# Patient Record
Sex: Female | Born: 1955
Health system: Southern US, Community
[De-identification: ages and names within clinical notes are randomized; demographics above are authoritative.]

## PROBLEM LIST (undated history)

## (undated) DIAGNOSIS — K2970 Gastritis, unspecified, without bleeding: Secondary | ICD-10-CM

## (undated) DIAGNOSIS — F09 Unspecified mental disorder due to known physiological condition: Secondary | ICD-10-CM

## (undated) DIAGNOSIS — M797 Fibromyalgia: Secondary | ICD-10-CM

## (undated) DIAGNOSIS — D649 Anemia, unspecified: Secondary | ICD-10-CM

## (undated) DIAGNOSIS — F32A Depression, unspecified: Secondary | ICD-10-CM

## (undated) DIAGNOSIS — N3281 Overactive bladder: Secondary | ICD-10-CM

## (undated) DIAGNOSIS — M810 Age-related osteoporosis without current pathological fracture: Secondary | ICD-10-CM

## (undated) DIAGNOSIS — M4802 Spinal stenosis, cervical region: Secondary | ICD-10-CM

## (undated) DIAGNOSIS — E119 Type 2 diabetes mellitus without complications: Secondary | ICD-10-CM

## (undated) DIAGNOSIS — M7500 Adhesive capsulitis of unspecified shoulder: Secondary | ICD-10-CM

## (undated) DIAGNOSIS — G5600 Carpal tunnel syndrome, unspecified upper limb: Secondary | ICD-10-CM

## (undated) DIAGNOSIS — E785 Hyperlipidemia, unspecified: Secondary | ICD-10-CM

## (undated) DIAGNOSIS — K219 Gastro-esophageal reflux disease without esophagitis: Secondary | ICD-10-CM

## (undated) DIAGNOSIS — Z9889 Other specified postprocedural states: Secondary | ICD-10-CM

## (undated) DIAGNOSIS — D1724 Benign lipomatous neoplasm of skin and subcutaneous tissue of left leg: Secondary | ICD-10-CM

## (undated) DIAGNOSIS — I10 Essential (primary) hypertension: Secondary | ICD-10-CM

## (undated) DIAGNOSIS — E538 Deficiency of other specified B group vitamins: Secondary | ICD-10-CM

## (undated) DIAGNOSIS — F419 Anxiety disorder, unspecified: Secondary | ICD-10-CM

## (undated) DIAGNOSIS — B9681 Helicobacter pylori [H. pylori] as the cause of diseases classified elsewhere: Secondary | ICD-10-CM

## (undated) DIAGNOSIS — E559 Vitamin D deficiency, unspecified: Secondary | ICD-10-CM

## (undated) DIAGNOSIS — M48061 Spinal stenosis, lumbar region without neurogenic claudication: Secondary | ICD-10-CM

## (undated) DIAGNOSIS — I5030 Unspecified diastolic (congestive) heart failure: Secondary | ICD-10-CM

## (undated) HISTORY — PX: COLONOSCOPY: SHX174

## (undated) HISTORY — DX: Type 2 diabetes mellitus without complications: E11.9

## (undated) HISTORY — PX: COLPORRHAPHY: SHX921

## (undated) HISTORY — PX: ABDOMINAL HYSTERECTOMY: SHX81

## (undated) HISTORY — PX: LIPOMA EXCISION: SHX5283

## (undated) HISTORY — PX: ESOPHAGOGASTRODUODENOSCOPY: SHX1529

---

## 2006-10-13 DIAGNOSIS — M25519 Pain in unspecified shoulder: Secondary | ICD-10-CM | POA: Insufficient documentation

## 2006-10-13 DIAGNOSIS — E1165 Type 2 diabetes mellitus with hyperglycemia: Secondary | ICD-10-CM | POA: Insufficient documentation

## 2008-04-18 DIAGNOSIS — R1031 Right lower quadrant pain: Secondary | ICD-10-CM | POA: Insufficient documentation

## 2008-07-26 DIAGNOSIS — D508 Other iron deficiency anemias: Secondary | ICD-10-CM | POA: Insufficient documentation

## 2009-10-08 DIAGNOSIS — J984 Other disorders of lung: Secondary | ICD-10-CM | POA: Insufficient documentation

## 2013-03-28 DIAGNOSIS — M48061 Spinal stenosis, lumbar region without neurogenic claudication: Secondary | ICD-10-CM | POA: Insufficient documentation

## 2013-03-28 DIAGNOSIS — Z794 Long term (current) use of insulin: Secondary | ICD-10-CM | POA: Insufficient documentation

## 2014-12-24 DIAGNOSIS — Z9889 Other specified postprocedural states: Secondary | ICD-10-CM | POA: Insufficient documentation

## 2014-12-26 DIAGNOSIS — D126 Benign neoplasm of colon, unspecified: Secondary | ICD-10-CM | POA: Insufficient documentation

## 2015-01-01 DIAGNOSIS — K2970 Gastritis, unspecified, without bleeding: Secondary | ICD-10-CM | POA: Insufficient documentation

## 2015-05-14 DIAGNOSIS — M797 Fibromyalgia: Secondary | ICD-10-CM | POA: Insufficient documentation

## 2015-05-15 DIAGNOSIS — E559 Vitamin D deficiency, unspecified: Secondary | ICD-10-CM | POA: Insufficient documentation

## 2015-05-15 DIAGNOSIS — E782 Mixed hyperlipidemia: Secondary | ICD-10-CM | POA: Insufficient documentation

## 2015-06-09 DIAGNOSIS — F418 Other specified anxiety disorders: Secondary | ICD-10-CM | POA: Insufficient documentation

## 2015-10-06 DIAGNOSIS — Z860101 Personal history of adenomatous and serrated colon polyps: Secondary | ICD-10-CM | POA: Insufficient documentation

## 2016-01-19 ENCOUNTER — Encounter: Payer: Self-pay | Admitting: Psychology

## 2016-01-29 ENCOUNTER — Encounter: Payer: Self-pay | Admitting: Podiatry

## 2016-01-29 ENCOUNTER — Ambulatory Visit (INDEPENDENT_AMBULATORY_CARE_PROVIDER_SITE_OTHER): Payer: Medicare Other | Admitting: Podiatry

## 2016-01-29 VITALS — BP 117/57 | HR 66 | Resp 18

## 2016-01-29 DIAGNOSIS — M79676 Pain in unspecified toe(s): Secondary | ICD-10-CM

## 2016-01-29 DIAGNOSIS — E119 Type 2 diabetes mellitus without complications: Secondary | ICD-10-CM

## 2016-01-29 DIAGNOSIS — B351 Tinea unguium: Secondary | ICD-10-CM

## 2016-01-29 NOTE — Progress Notes (Signed)
   Subjective:    Patient ID: Patricia Knapp, female    DOB: 02/25/1956, 60 y.o.   MRN: [id number]  HPI this patient presents the office with chief complaint of long thick painful nails. Patient states the nails are painful walking and wearing her shoes. She says she has just moved to the area and has been seeing a podiatrist at her old city. She says that she is diabetic with sleep apnea, fibromyalgia and back stenosis. She presents the office today for an evaluation of her feet and treatment of her long thick painful nails    Review of Systems  All other systems reviewed and are negative.      Objective:   Physical Exam GENERAL APPEARANCE: Alert, conversant. Appropriately groomed. No acute distress.  VASCULAR: Pedal pulses are  palpable at  [facility] bilateral.  Capillary refill time is immediate to all digits,  Normal temperature gradient.   NEUROLOGIC: sensation is normal to 5.07 monofilament at 5/5 sites bilateral.  Light touch is intact bilateral, Muscle strength normal.  MUSCULOSKELETAL: acceptable muscle strength, tone and stability bilateral.  Intrinsic muscluature intact bilateral.  Rectus appearance of foot and digits noted bilateral.   DERMATOLOGIC: skin color, texture, and turgor are within normal limits.  No preulcerative lesions or ulcers  are seen, no interdigital maceration noted.  No open lesions present.  . No drainage noted.  NAILS  Thick disfigured discolored nails both feet.         Assessment & Plan:  Onychomycosis  B/L  Diabetes with no complications.  IE.  Diabetic foot exam was performed and found to be within normal limits. Debridement of mycotic nails was performed. Return to the clinic in 3 months for preventative foot care services   Helane GuntherGregory Mayer DPM

## 2016-03-02 ENCOUNTER — Encounter: Payer: Self-pay | Admitting: Psychology

## 2016-03-08 ENCOUNTER — Encounter: Payer: Self-pay | Admitting: Psychology

## 2016-04-12 ENCOUNTER — Encounter: Payer: Self-pay | Admitting: Psychology

## 2016-04-22 ENCOUNTER — Ambulatory Visit: Payer: Medicare Other | Admitting: Podiatry

## 2016-07-19 DIAGNOSIS — E785 Hyperlipidemia, unspecified: Secondary | ICD-10-CM | POA: Insufficient documentation

## 2019-04-16 ENCOUNTER — Ambulatory Visit: Payer: MEDICARE | Admitting: Podiatry

## 2019-05-30 ENCOUNTER — Other Ambulatory Visit: Payer: Self-pay | Admitting: Orthopedic Surgery

## 2019-05-30 DIAGNOSIS — G8929 Other chronic pain: Secondary | ICD-10-CM

## 2019-05-30 DIAGNOSIS — M5442 Lumbago with sciatica, left side: Secondary | ICD-10-CM

## 2019-05-30 DIAGNOSIS — M4807 Spinal stenosis, lumbosacral region: Secondary | ICD-10-CM

## 2019-06-13 ENCOUNTER — Ambulatory Visit: Payer: Medicare HMO

## 2019-07-02 ENCOUNTER — Ambulatory Visit: Payer: Medicare HMO

## 2019-07-04 ENCOUNTER — Ambulatory Visit: Payer: Medicare HMO

## 2020-04-21 DIAGNOSIS — R6889 Other general symptoms and signs: Secondary | ICD-10-CM | POA: Diagnosis not present

## 2020-04-21 DIAGNOSIS — E1142 Type 2 diabetes mellitus with diabetic polyneuropathy: Secondary | ICD-10-CM | POA: Diagnosis not present

## 2020-04-21 DIAGNOSIS — Z6841 Body Mass Index (BMI) 40.0 and over, adult: Secondary | ICD-10-CM | POA: Diagnosis not present

## 2020-04-21 DIAGNOSIS — B351 Tinea unguium: Secondary | ICD-10-CM | POA: Diagnosis not present

## 2020-05-27 ENCOUNTER — Emergency Department
Admission: EM | Admit: 2020-05-27 | Discharge: 2020-05-27 | Disposition: A | Discharge status: Home / Self Care | Payer: Medicare HMO | Attending: Emergency Medicine | Admitting: Emergency Medicine

## 2020-05-27 ENCOUNTER — Emergency Department: Payer: Medicare HMO

## 2020-05-27 ENCOUNTER — Ambulatory Visit: Payer: Self-pay

## 2020-05-27 ENCOUNTER — Other Ambulatory Visit: Payer: Self-pay

## 2020-05-27 DIAGNOSIS — R071 Chest pain on breathing: Secondary | ICD-10-CM | POA: Diagnosis not present

## 2020-05-27 DIAGNOSIS — E119 Type 2 diabetes mellitus without complications: Secondary | ICD-10-CM | POA: Diagnosis not present

## 2020-05-27 DIAGNOSIS — J45901 Unspecified asthma with (acute) exacerbation: Secondary | ICD-10-CM | POA: Insufficient documentation

## 2020-05-27 DIAGNOSIS — Z79899 Other long term (current) drug therapy: Secondary | ICD-10-CM | POA: Diagnosis not present

## 2020-05-27 DIAGNOSIS — R059 Cough, unspecified: Secondary | ICD-10-CM | POA: Diagnosis not present

## 2020-05-27 DIAGNOSIS — J42 Unspecified chronic bronchitis: Secondary | ICD-10-CM | POA: Insufficient documentation

## 2020-05-27 DIAGNOSIS — Z91040 Latex allergy status: Secondary | ICD-10-CM | POA: Diagnosis not present

## 2020-05-27 DIAGNOSIS — J181 Lobar pneumonia, unspecified organism: Secondary | ICD-10-CM | POA: Insufficient documentation

## 2020-05-27 DIAGNOSIS — K449 Diaphragmatic hernia without obstruction or gangrene: Secondary | ICD-10-CM | POA: Diagnosis not present

## 2020-05-27 DIAGNOSIS — E1165 Type 2 diabetes mellitus with hyperglycemia: Secondary | ICD-10-CM | POA: Diagnosis not present

## 2020-05-27 DIAGNOSIS — Z20822 Contact with and (suspected) exposure to covid-19: Secondary | ICD-10-CM | POA: Diagnosis not present

## 2020-05-27 DIAGNOSIS — Z794 Long term (current) use of insulin: Secondary | ICD-10-CM | POA: Diagnosis not present

## 2020-05-27 DIAGNOSIS — R079 Chest pain, unspecified: Secondary | ICD-10-CM | POA: Diagnosis not present

## 2020-05-27 DIAGNOSIS — J189 Pneumonia, unspecified organism: Secondary | ICD-10-CM | POA: Diagnosis not present

## 2020-05-27 DIAGNOSIS — R0789 Other chest pain: Secondary | ICD-10-CM | POA: Diagnosis not present

## 2020-05-27 DIAGNOSIS — R0602 Shortness of breath: Secondary | ICD-10-CM | POA: Diagnosis not present

## 2020-05-27 DIAGNOSIS — J4531 Mild persistent asthma with (acute) exacerbation: Secondary | ICD-10-CM | POA: Diagnosis not present

## 2020-05-27 LAB — CBC WITH DIFFERENTIAL/PLATELET
Abs Immature Granulocytes: 0.11 10*3/uL — ABNORMAL HIGH (ref 0.00–0.07)
Basophils Absolute: 0.1 10*3/uL (ref 0.0–0.1)
Basophils Relative: 1 %
Eosinophils Absolute: 0.1 10*3/uL (ref 0.0–0.5)
Eosinophils Relative: 0 %
HCT: 31.7 % — ABNORMAL LOW (ref 36.0–46.0)
Hemoglobin: 10 g/dL — ABNORMAL LOW (ref 12.0–15.0)
Immature Granulocytes: 1 %
Lymphocytes Relative: 7 %
Lymphs Abs: 1.1 10*3/uL (ref 0.7–4.0)
MCH: 23.9 pg — ABNORMAL LOW (ref 26.0–34.0)
MCHC: 31.5 g/dL (ref 30.0–36.0)
MCV: 75.8 fL — ABNORMAL LOW (ref 80.0–100.0)
Monocytes Absolute: 1.3 10*3/uL — ABNORMAL HIGH (ref 0.1–1.0)
Monocytes Relative: 9 %
Neutro Abs: 11.9 10*3/uL — ABNORMAL HIGH (ref 1.7–7.7)
Neutrophils Relative %: 82 %
Platelets: 323 10*3/uL (ref 150–400)
RBC: 4.18 MIL/uL (ref 3.87–5.11)
RDW: 18 % — ABNORMAL HIGH (ref 11.5–15.5)
WBC: 14.5 10*3/uL — ABNORMAL HIGH (ref 4.0–10.5)
nRBC: 0 % (ref 0.0–0.2)

## 2020-05-27 LAB — COMPREHENSIVE METABOLIC PANEL
ALT: 16 U/L (ref 0–44)
AST: 19 U/L (ref 15–41)
Albumin: 3.3 g/dL — ABNORMAL LOW (ref 3.5–5.0)
Alkaline Phosphatase: 67 U/L (ref 38–126)
Anion gap: 10 (ref 5–15)
BUN: 24 mg/dL — ABNORMAL HIGH (ref 8–23)
CO2: 25 mmol/L (ref 22–32)
Calcium: 9 mg/dL (ref 8.9–10.3)
Chloride: 101 mmol/L (ref 98–111)
Creatinine, Ser: 1.12 mg/dL — ABNORMAL HIGH (ref 0.44–1.00)
GFR, Estimated: 55 mL/min — ABNORMAL LOW (ref >60)
Glucose, Bld: 173 mg/dL — ABNORMAL HIGH (ref 70–99)
Potassium: 3.8 mmol/L (ref 3.5–5.1)
Sodium: 136 mmol/L (ref 135–145)
Total Bilirubin: 0.9 mg/dL (ref 0.3–1.2)
Total Protein: 7.6 g/dL (ref 6.5–8.1)

## 2020-05-27 LAB — RESP PANEL BY RT-PCR (FLU A&B, COVID) ARPGX2
Influenza A by PCR: NEGATIVE
Influenza B by PCR: NEGATIVE
SARS Coronavirus 2 by RT PCR: NEGATIVE

## 2020-05-27 LAB — TROPONIN I (HIGH SENSITIVITY): Troponin I (High Sensitivity): 9 ng/L (ref <18)

## 2020-05-27 LAB — D-DIMER, QUANTITATIVE: D-Dimer, Quant: 2.01 ug/mL-FEU — ABNORMAL HIGH (ref 0.00–0.50)

## 2020-05-27 MED ORDER — AMOXICILLIN-POT CLAVULANATE 875-125 MG PO TABS: 1.0000 | ORAL_TABLET | Freq: Two times a day (BID) | ORAL | Status: DC

## 2020-05-27 MED ORDER — AZITHROMYCIN 500 MG PO TABS
500.0000 mg | ORAL_TABLET | Freq: Once | ORAL | Status: AC
  Administered 2020-05-27: 500 mg via ORAL
  Filled 2020-05-27: qty 1

## 2020-05-27 MED ORDER — AZITHROMYCIN 250 MG PO TABS: 250.0000 mg | ORAL_TABLET | Freq: Every day | ORAL | 0 refills | Status: AC

## 2020-05-27 MED ORDER — IPRATROPIUM-ALBUTEROL 0.5-2.5 (3) MG/3ML IN SOLN
3.0000 mL | Freq: Once | RESPIRATORY_TRACT | Status: AC
  Administered 2020-05-27: 3 mL via RESPIRATORY_TRACT
  Filled 2020-05-27: qty 3

## 2020-05-27 MED ORDER — BENZONATATE 100 MG PO CAPS: ORAL_CAPSULE | ORAL | 0 refills | Status: DC

## 2020-05-27 MED ORDER — AMOXICILLIN-POT CLAVULANATE 875-125 MG PO TABS
1.0000 | ORAL_TABLET | Freq: Once | ORAL | Status: AC
  Administered 2020-05-27: 1 via ORAL
  Filled 2020-05-27: qty 1

## 2020-05-27 MED ORDER — IOHEXOL 350 MG/ML SOLN
75.0000 mL | Freq: Once | INTRAVENOUS | Status: AC | PRN
  Administered 2020-05-27: 75 mL via INTRAVENOUS
  Filled 2020-05-27: qty 75

## 2020-05-27 MED ORDER — PREDNISONE 20 MG PO TABS
60.0000 mg | ORAL_TABLET | Freq: Once | ORAL | Status: AC
  Administered 2020-05-27: 60 mg via ORAL
  Filled 2020-05-27: qty 3

## 2020-05-27 MED ORDER — AMOXICILLIN-POT CLAVULANATE 875-125 MG PO TABS: 1.0000 | ORAL_TABLET | Freq: Two times a day (BID) | ORAL | 0 refills | Status: AC

## 2020-05-27 MED ORDER — BENZONATATE 100 MG PO CAPS
200.0000 mg | ORAL_CAPSULE | Freq: Once | ORAL | Status: AC
  Administered 2020-05-27: 200 mg via ORAL
  Filled 2020-05-27: qty 2

## 2020-05-27 NOTE — ED Notes (Signed)
Sitting on side of bed

## 2020-05-27 NOTE — ED Provider Notes (Signed)
-----------------------------------------   5:32 PM on 05/27/2020 -----------------------------------------  Blood pressure 130/60, pulse 70, temperature 98.5 F (36.9 C), temperature source Oral, resp. rate 16, height 5\' 7"  (1.702 m), weight 119.7 kg, SpO2 97 %.  Assuming care from Dr. , PA-C/NP-C.  In short, Patricia Knapp is a 65 y.o. female with a chief complaint of URI .  Refer to the original H&P for additional details.  The current plan of care is to await the results of the CT angio and disposition appropriately. ____________________________________________   RADIOLOGY  CT Angio Chest w/ w/o CM  IMPRESSION: 1. Rounded masslike consolidation within the superior segment right lower lobe as above. While this could reflect pneumonia, close follow-up is recommended after appropriate medical management to ensure resolution. Followup chest CT is recommended in 3-4 weeks following trial of antibiotic therapy to ensure resolution and exclude underlying malignancy, as this area is not apparent on chest x-ray. 2. Coronary artery atherosclerosis. 3. No evidence of pulmonary embolus. ____________________________________________  PROCEDURES  Benzonatate 200 mg PO Amoxicillin-clavulanate 875-125 mg PO Azithromycin 500 mg PO DuoNeb x 1  Procedures ____________________________________________  INITIAL IMPRESSION / ASSESSMENT AND PLAN / ED COURSE  Differential includes, but is not limited to, viral syndrome, bronchitis including COPD exacerbation, pneumonia, reactive airway disease including asthma, CHF including exacerbation with or without pulmonary/interstitial edema, pneumothorax, ACS, thoracic trauma, and pulmonary embolism.  Patient with ED evaluation of productive cough and chest pain since Friday.  Patient describes pleuritic chest pain with symptoms worsening with deep breaths.  She was brought in [facility] for her [facility].  She  was found on exam to have elevated D-dimer, and subsequent CT angio chest was performed.  It did not reveal any pulmonary emboli, but did reveal a concerning rounded masslike consolidation to the upper portion of the  right lower lobe.  Patient is treated empirically for community-acquired pneumonia with additional doses of Augmentin and azithromycin in the ED patient also given additional nebulizer treatment as she continued to have some intermittent cough.  She is otherwise stable without complaint at this time.  She is reassured overall by her CT findings, and is encouraged to follow-up with a repeat CT in 3 to 4 weeks.  Return precautions have been discussed.  EDRA Knapp was evaluated in Emergency Department on 05/27/2020 for the symptoms described in the history of present illness. She was evaluated in the context of the global COVID-19 pandemic, which necessitated consideration that the patient might be at risk for infection with the SARS-CoV-2 virus that causes COVID-19. Institutional protocols and algorithms that pertain to the evaluation of patients at risk for COVID-19 are in a state of rapid change based on information released by regulatory bodies including the CDC and federal and state organizations. These policies and algorithms were followed during the patient's care in the ED. ____________________________________________  FINAL CLINICAL IMPRESSION(S) / ED DIAGNOSES  Final diagnoses:  Mild asthma with acute exacerbation, unspecified whether persistent  Chronic bronchitis, unspecified chronic bronchitis type [facility])  Community acquired pneumonia of right lower lobe of lung      [facility], Karmen Stabs, PA-C 05/27/20 1743    05/29/20, MD 05/27/20 1944

## 2020-05-27 NOTE — ED Notes (Signed)
Brought to room after chest xray by wheelchair.  Has frequent hacky cough.  Says she occasionally has some sputum.  Says she has had cough for a month and is gradually getting worse.  Says she did teledoc and was put on benzonate and steroid nasal spray and did not help.  Over the weekend increase in coughing.  No fever.  Her cough is uncontrollable.

## 2020-05-27 NOTE — ED Provider Notes (Addendum)
[facility] Emergency Department Provider Note  ____________________________________________   Event Date/Time   First MD Initiated Contact with Patient 05/27/20 1036     (approximate)  I have reviewed the triage vital signs and the nursing notes.   HISTORY  Chief Complaint URI   HPI Patricia Knapp is a 65 y.o. female Patricia Knapp to the ED with complaint of cough for 1 month that is gradually getting worse.  States that she did a video visit and was put on [employer] and a steroid nasal spray which did not help.  Over the weekend coughing has increased.  She denies any fever, chills, nausea or vomiting.  Patient also has a history of asthma and is out of her inhaler.  Patient reports that she does not have a PCP in this area.  Patient denies any change in taste or smell and has completed all 3 doses of the Moderna vaccine for COVID.     Past Medical History:  Diagnosis Date  . Diabetes mellitus without complication (HCC)     There are no problems to display for this patient.   History reviewed. No pertinent surgical history.  Prior to Admission medications   Medication Sig Start Date End Date Taking? Authorizing Provider  ACCU-CHEK AVIVA PLUS test strip  12/10/15  Yes [provider]  albuterol (PROVENTIL HFA;VENTOLIN HFA) 108 (90 Base) MCG/ACT inhaler Inhale into the lungs. 03/21/14  Yes [provider]  amLODipine (NORVASC) 10 MG tablet Take by mouth. 05/14/15  Yes [provider]  carvedilol (COREG) 25 MG tablet  01/08/16  Yes [provider]  insulin aspart (NOVOLOG) 100 UNIT/ML FlexPen Inject into the skin. 05/14/15  Yes [provider]  Insulin Pen Needle 31G X 5 MM MISC as directed. 05/14/15  Yes [provider]  valsartan-hydrochlorothiazide (DIOVAN-HCT) 320-25 MG tablet Take by mouth. 05/14/15  Yes [provider]  carvedilol (COREG) 25 MG tablet Take by mouth. 05/16/15   [provider]  cyclobenzaprine (FEXMID) 7.5 MG tablet  12/04/15   [provider]  FLUoxetine (PROZAC) 20 MG capsule TAKE 1 CAPSULE BY MOUTH DAILY 04/28/15   [provider]  furosemide (LASIX) 20 MG tablet Take by mouth. 05/14/15   [provider]  gabapentin (NEURONTIN) 300 MG capsule Take by mouth. 10/28/15 10/27/16  [provider]  insulin glargine (LANTUS) 100 UNIT/ML Solostar Pen Inject into the skin. 05/14/15   [provider]  insulin regular human CONCENTRATED (HUMULIN R) 500 UNIT/ML kwikpen  07/21/15   [provider]  LANTUS SOLOSTAR 100 UNIT/ML Solostar Pen  12/22/15   [provider]  NOVOLOG FLEXPEN 100 UNIT/ML FlexPen  12/22/15   [provider]  omega-3 acid ethyl esters (LOVAZA) 1 g capsule  01/08/16   [provider]  omega-3 acid ethyl esters (LOVAZA) 1 g capsule Take by mouth. 05/14/15   [provider]  pantoprazole (PROTONIX) 40 MG tablet Take by mouth. 05/14/15   [provider]  tiZANidine (ZANAFLEX) 2 MG tablet  03/20/15   [provider]  TRESIBA FLEXTOUCH 100 UNIT/ML FlexTouch Pen Inject 40 Units into the skin 2 (two) times daily. 01/28/20   [provider]  UNABLE TO FIND Use 3 (three) times daily. Uses Relion E11.40 07/30/15   [provider]  UNABLE TO FIND Inject into the skin. liraglutide (VICTOZA) 0.6 mg/0.1 mL (18 mg/3 mL) pen injector 05/14/15   [provider]  UNKNOWN TO PATIENT Use 1 each  3 (three) times daily. Uses Relion E11.40 07/30/15   [provider]  venlafaxine XR (EFFEXOR-XR) 75 MG 24 hr capsule Take by mouth. 10/10/15 10/09/16  [provider]  Vitamin D, Ergocalciferol, (DRISDOL) 50000 units CAPS capsule Take by mouth. 05/14/15   [provider]    Allergies Latex, Aspirin, Celecoxib, Codeine, Hydrocodone, Ibuprofen, Ketorolac, Meloxicam, Metformin, Morphine, Oxycodone, Rofecoxib, Tramadol, and  Lovastatin  No family history on file.  Social History Social History   Tobacco Use  . Smoking status: Never Smoker  . Smokeless tobacco: Never Used  Substance Use Topics  . Alcohol use: No  . Drug use: No    Review of Systems Constitutional: No fever/chills Eyes: No visual changes. ENT: No sore throat. Cardiovascular: Denies chest pain. Respiratory: Denies shortness of breath. Gastrointestinal: No abdominal pain.  No nausea, no vomiting.  No diarrhea.  No constipation. Genitourinary: Negative for dysuria. Musculoskeletal: Negative for back pain. Skin: Negative for rash. Neurological: Negative for headaches, focal weakness or numbness. Endocrine : Positive diabetes ____________________________________________   PHYSICAL EXAM:  VITAL SIGNS: ED Triage Vitals [05/27/20 1019]  Enc Vitals Group     BP (!) 135/49     Pulse Rate 70     Resp 18     Temp 98.5 F (36.9 C)     Temp Source Oral     SpO2 97 %     Weight      Height      Head Circumference      Peak Flow      Pain Score      Pain Loc      Pain Edu?      Excl. in [place]?     Constitutional: Alert and oriented. Well appearing and in no acute distress. Eyes: Conjunctivae are normal. PERRL. EOMI. Head: Atraumatic. Nose: No congestion/rhinnorhea. Mouth/Throat: Mucous membranes are moist.   Neck: No stridor.   Cardiovascular: Normal rate, regular rhythm. Grossly normal heart sounds.  Good peripheral circulation. Respiratory: Normal respiratory effort.  No retractions. Lungs no wheezing is noted but patient does have a very coarse frequent cough.  She is still able to speak in complete sentences without any difficulty. Gastrointestinal: Soft and nontender. No distention.  Musculoskeletal: No lower extremity tenderness nor edema.  No joint effusions. Neurologic:  Normal speech and language. No gross focal neurologic deficits are appreciated.  Skin:  Skin is warm, dry and intact. No rash noted. Psychiatric: Mood  and affect are normal. Speech and behavior are normal.  ____________________________________________   LABS (all labs ordered are listed, but only abnormal results are displayed)  Labs Reviewed  CBC WITH DIFFERENTIAL/PLATELET - Abnormal; Notable for the following components:      Result Value   WBC 14.5 (*)    Hemoglobin 10.0 (*)    HCT 31.7 (*)    MCV 75.8 (*)    MCH 23.9 (*)    RDW 18.0 (*)    Neutro Abs 11.9 (*)    Monocytes Absolute 1.3 (*)    Abs Immature Granulocytes 0.11 (*)    All other components within normal limits  COMPREHENSIVE METABOLIC PANEL - Abnormal; Notable for the following components:   Glucose, Bld 173 (*)    BUN 24 (*)    Creatinine, Ser 1.12 (*)    Albumin 3.3 (*)    GFR, Estimated 55 (*)    All other components within normal limits  D-DIMER, QUANTITATIVE - Abnormal; Notable for the following components:   D-Dimer, Quant 2.01 (*)  All other components within normal limits  RESP PANEL BY RT-PCR (FLU A&B, COVID) ARPGX2  TROPONIN I (HIGH SENSITIVITY)   ____________________________________________  EKG  Reviewed by physician on the major ED. Normal sinus rhythm with a sinus arrhythmia.  Ventricular rate 76. ____________________________________________  RADIOLOGY Beaulah Corin, personally viewed and evaluated these images (plain radiographs) as part of my medical decision making, as well as reviewing the written report by the radiologist.   Official radiology report(s): DG Chest 2 View  Result Date: 05/27/2020 CLINICAL DATA:  Cough and chest pain EXAM: CHEST - 2 VIEW COMPARISON:  None. FINDINGS: Lungs are clear. Heart size and pulmonary vascularity are normal. No adenopathy. No pneumothorax. No bone lesions. IMPRESSION: Lungs clear.  Cardiac silhouette normal. Electronically Signed   By: Bretta Bang III M.D.   On: 05/27/2020 11:03    ____________________________________________   PROCEDURES  Procedure(s) performed (including  Critical Care):  Procedures   ____________________________________________   INITIAL IMPRESSION / ASSESSMENT AND PLAN / ED COURSE  As part of my medical decision making, I reviewed the following data within the electronic MEDICAL RECORD NUMBER Notes from prior ED visits and [place] Controlled Substance Database  Dr. Larinda Buttery was back to see the patient and evaluate.  Patient states to him that she has been out of her albuterol inhaler for some time and has not had anything to use for her asthma.  D-dimer was ordered and if elevated a CT angio will be ordered.  IV team will need to be called as patient is a very hard stick.   ----------------------------------------- 4:06 PM on 05/27/2020 -----------------------------------------   Leitha Bleak will be taking over the care of this patient at this time.  ____________________________________________   FINAL CLINICAL IMPRESSION(S) / ED DIAGNOSES  Final diagnoses:  Mild asthma with acute exacerbation, unspecified whether persistent  Chronic bronchitis, unspecified chronic bronchitis type [facility])     ED Discharge Orders    None      *Please note:  Patricia Knapp was evaluated in Emergency Department on 05/27/2020 for the symptoms described in the history of present illness. She was evaluated in the context of the global COVID-19 pandemic, which necessitated consideration that the patient might be at risk for infection with the SARS-CoV-2 virus that causes COVID-19. Institutional protocols and algorithms that pertain to the evaluation of patients at risk for COVID-19 are in a state of rapid change based on information released by regulatory bodies including the CDC and federal and state organizations. These policies and algorithms were followed during the patient's care in the ED.  Some ED evaluations and interventions may be delayed as a result of limited staffing during and the pandemic.*   Note:  This document was prepared using Dragon  voice recognition software and may include unintentional dictation errors.    Tommi Rumps, PA-C 05/27/20 1606    Tommi Rumps, PA-C 05/27/20 1607    Chesley Noon, MD 05/28/20 [phone]

## 2020-05-27 NOTE — ED Notes (Signed)
No repeat trop needed , Per PA [place]

## 2020-05-27 NOTE — ED Notes (Signed)
States she has had a cough for about 1 month  Now having some discomfort in chest with cough and inspiration  Afebrile on arrival

## 2020-05-27 NOTE — ED Triage Notes (Addendum)
Pt [facility] with c/o productive cough with chest pain since Friday, states it hurts to take a deep breath Pt noted to have a barking cough in the WR  98%RA [id number] 145/61 72HR 99.4 temp

## 2020-05-27 NOTE — Discharge Instructions (Addendum)
Your exam, labs, and CT revealed a concerning masslike structure to the right lower lobe.  It is concerning for pneumonia at this time.  Will be treated with 2 different antibiotics.  Continue take your home inhalers and nebulizer treatments as necessary.  Take the cough medicine as needed.  You should follow-up with your primary provider in 3 to 4 weeks for repeat CT scan to confirm resolution.  Return to the ED sooner if needed.

## 2020-06-30 DIAGNOSIS — G4733 Obstructive sleep apnea (adult) (pediatric): Secondary | ICD-10-CM | POA: Insufficient documentation

## 2020-06-30 DIAGNOSIS — R918 Other nonspecific abnormal finding of lung field: Secondary | ICD-10-CM | POA: Insufficient documentation

## 2020-06-30 DIAGNOSIS — K219 Gastro-esophageal reflux disease without esophagitis: Secondary | ICD-10-CM | POA: Insufficient documentation

## 2020-06-30 DIAGNOSIS — D649 Anemia, unspecified: Secondary | ICD-10-CM | POA: Insufficient documentation

## 2020-07-09 ENCOUNTER — Other Ambulatory Visit: Payer: Self-pay | Admitting: Family Medicine

## 2020-07-09 ENCOUNTER — Other Ambulatory Visit (HOSPITAL_COMMUNITY): Payer: Self-pay | Admitting: Family Medicine

## 2020-07-09 DIAGNOSIS — R918 Other nonspecific abnormal finding of lung field: Secondary | ICD-10-CM

## 2020-08-12 ENCOUNTER — Ambulatory Visit: Payer: Medicare HMO

## 2020-08-25 ENCOUNTER — Ambulatory Visit: Admission: RE | Admit: 2020-08-25 | Payer: Medicare HMO | Source: Ambulatory Visit

## 2020-09-05 ENCOUNTER — Ambulatory Visit: Admission: RE | Admit: 2020-09-05 | Payer: Medicare HMO | Source: Ambulatory Visit

## 2020-09-19 ENCOUNTER — Ambulatory Visit: Payer: Medicare HMO

## 2021-01-01 ENCOUNTER — Other Ambulatory Visit: Payer: Self-pay | Admitting: Family Medicine

## 2021-01-01 DIAGNOSIS — R918 Other nonspecific abnormal finding of lung field: Secondary | ICD-10-CM

## 2021-01-07 ENCOUNTER — Other Ambulatory Visit: Payer: Self-pay | Admitting: Internal Medicine

## 2021-01-07 ENCOUNTER — Other Ambulatory Visit (HOSPITAL_BASED_OUTPATIENT_CLINIC_OR_DEPARTMENT_OTHER): Payer: Self-pay | Admitting: Internal Medicine

## 2021-01-07 DIAGNOSIS — R29898 Other symptoms and signs involving the musculoskeletal system: Secondary | ICD-10-CM

## 2021-01-07 DIAGNOSIS — M792 Neuralgia and neuritis, unspecified: Secondary | ICD-10-CM

## 2021-01-07 DIAGNOSIS — G8929 Other chronic pain: Secondary | ICD-10-CM

## 2021-01-07 DIAGNOSIS — R200 Anesthesia of skin: Secondary | ICD-10-CM

## 2021-01-07 DIAGNOSIS — R202 Paresthesia of skin: Secondary | ICD-10-CM

## 2021-01-07 DIAGNOSIS — M47812 Spondylosis without myelopathy or radiculopathy, cervical region: Secondary | ICD-10-CM

## 2021-01-17 ENCOUNTER — Ambulatory Visit: Payer: Medicare HMO

## 2021-01-17 ENCOUNTER — Ambulatory Visit
Admission: RE | Admit: 2021-01-17 | Discharge: 2021-01-17 | Disposition: A | Discharge status: Home / Self Care | Payer: Medicare HMO | Source: Ambulatory Visit | Attending: Internal Medicine | Admitting: Internal Medicine

## 2021-01-17 DIAGNOSIS — M792 Neuralgia and neuritis, unspecified: Secondary | ICD-10-CM | POA: Insufficient documentation

## 2021-01-17 DIAGNOSIS — R202 Paresthesia of skin: Secondary | ICD-10-CM | POA: Diagnosis present

## 2021-01-17 DIAGNOSIS — R29898 Other symptoms and signs involving the musculoskeletal system: Secondary | ICD-10-CM | POA: Diagnosis present

## 2021-01-17 DIAGNOSIS — G8929 Other chronic pain: Secondary | ICD-10-CM | POA: Insufficient documentation

## 2021-01-17 DIAGNOSIS — R200 Anesthesia of skin: Secondary | ICD-10-CM | POA: Diagnosis present

## 2021-01-17 DIAGNOSIS — M542 Cervicalgia: Secondary | ICD-10-CM | POA: Insufficient documentation

## 2021-01-17 DIAGNOSIS — M47812 Spondylosis without myelopathy or radiculopathy, cervical region: Secondary | ICD-10-CM | POA: Insufficient documentation

## 2021-01-21 ENCOUNTER — Ambulatory Visit: Admission: RE | Admit: 2021-01-21 | Payer: Medicare HMO | Source: Ambulatory Visit

## 2021-02-02 DIAGNOSIS — M542 Cervicalgia: Secondary | ICD-10-CM | POA: Diagnosis not present

## 2021-02-02 DIAGNOSIS — M545 Low back pain, unspecified: Secondary | ICD-10-CM | POA: Diagnosis not present

## 2021-02-02 DIAGNOSIS — M25511 Pain in right shoulder: Secondary | ICD-10-CM | POA: Diagnosis not present

## 2021-02-03 DIAGNOSIS — Z419 Encounter for procedure for purposes other than remedying health state, unspecified: Secondary | ICD-10-CM | POA: Diagnosis not present

## 2021-02-03 DIAGNOSIS — M4802 Spinal stenosis, cervical region: Secondary | ICD-10-CM | POA: Diagnosis not present

## 2021-02-11 DIAGNOSIS — M25511 Pain in right shoulder: Secondary | ICD-10-CM | POA: Diagnosis not present

## 2021-02-11 DIAGNOSIS — M545 Low back pain, unspecified: Secondary | ICD-10-CM | POA: Diagnosis not present

## 2021-02-11 DIAGNOSIS — M542 Cervicalgia: Secondary | ICD-10-CM | POA: Diagnosis not present

## 2021-02-16 DIAGNOSIS — M9931 Osseous stenosis of neural canal of cervical region: Secondary | ICD-10-CM | POA: Diagnosis not present

## 2021-02-16 DIAGNOSIS — M47812 Spondylosis without myelopathy or radiculopathy, cervical region: Secondary | ICD-10-CM | POA: Diagnosis not present

## 2021-02-16 DIAGNOSIS — M9961 Osseous and subluxation stenosis of intervertebral foramina of cervical region: Secondary | ICD-10-CM | POA: Diagnosis not present

## 2021-02-16 DIAGNOSIS — Z419 Encounter for procedure for purposes other than remedying health state, unspecified: Secondary | ICD-10-CM | POA: Diagnosis not present

## 2021-02-19 ENCOUNTER — Other Ambulatory Visit: Payer: Self-pay

## 2021-02-19 ENCOUNTER — Ambulatory Visit
Admission: RE | Admit: 2021-02-19 | Discharge: 2021-02-19 | Disposition: A | Discharge status: Home / Self Care | Payer: Medicare Other | Source: Ambulatory Visit | Attending: Family Medicine | Admitting: Family Medicine

## 2021-02-19 DIAGNOSIS — R918 Other nonspecific abnormal finding of lung field: Secondary | ICD-10-CM

## 2021-02-19 DIAGNOSIS — R911 Solitary pulmonary nodule: Secondary | ICD-10-CM | POA: Diagnosis not present

## 2021-02-24 DIAGNOSIS — N1831 Chronic kidney disease, stage 3a: Secondary | ICD-10-CM | POA: Diagnosis not present

## 2021-02-24 DIAGNOSIS — I10 Essential (primary) hypertension: Secondary | ICD-10-CM | POA: Diagnosis not present

## 2021-02-24 DIAGNOSIS — G959 Disease of spinal cord, unspecified: Secondary | ICD-10-CM | POA: Diagnosis not present

## 2021-02-24 DIAGNOSIS — E11649 Type 2 diabetes mellitus with hypoglycemia without coma: Secondary | ICD-10-CM | POA: Diagnosis not present

## 2021-03-19 ENCOUNTER — Encounter (HOSPITAL_COMMUNITY): Payer: Self-pay | Admitting: Radiology

## 2021-03-20 ENCOUNTER — Emergency Department
Admission: EM | Admit: 2021-03-20 | Discharge: 2021-03-20 | Disposition: A | Discharge status: Home / Self Care | Payer: Medicare Other | Attending: Emergency Medicine | Admitting: Emergency Medicine

## 2021-03-20 ENCOUNTER — Other Ambulatory Visit: Payer: Self-pay

## 2021-03-20 ENCOUNTER — Encounter: Payer: Self-pay | Admitting: Emergency Medicine

## 2021-03-20 DIAGNOSIS — W06XXXA Fall from bed, initial encounter: Secondary | ICD-10-CM | POA: Diagnosis not present

## 2021-03-20 DIAGNOSIS — S0093XA Contusion of unspecified part of head, initial encounter: Secondary | ICD-10-CM | POA: Insufficient documentation

## 2021-03-20 DIAGNOSIS — M25519 Pain in unspecified shoulder: Secondary | ICD-10-CM | POA: Diagnosis not present

## 2021-03-20 DIAGNOSIS — M7918 Myalgia, other site: Secondary | ICD-10-CM

## 2021-03-20 DIAGNOSIS — Y92009 Unspecified place in unspecified non-institutional (private) residence as the place of occurrence of the external cause: Secondary | ICD-10-CM

## 2021-03-20 DIAGNOSIS — M791 Myalgia, unspecified site: Secondary | ICD-10-CM | POA: Insufficient documentation

## 2021-03-20 DIAGNOSIS — S0990XA Unspecified injury of head, initial encounter: Secondary | ICD-10-CM | POA: Diagnosis present

## 2021-03-20 HISTORY — DX: Spinal stenosis, lumbar region without neurogenic claudication: M48.061

## 2021-03-20 HISTORY — DX: Helicobacter pylori (H. pylori) as the cause of diseases classified elsewhere: B96.81

## 2021-03-20 HISTORY — DX: Fibromyalgia: M79.7

## 2021-03-20 HISTORY — DX: Morbid (severe) obesity due to excess calories: E66.01

## 2021-03-20 HISTORY — DX: Carpal tunnel syndrome, unspecified upper limb: G56.00

## 2021-03-20 HISTORY — DX: Hyperlipidemia, unspecified: E78.5

## 2021-03-20 HISTORY — DX: Spinal stenosis, cervical region: M48.02

## 2021-03-20 HISTORY — DX: Helicobacter pylori (H. pylori) as the cause of diseases classified elsewhere: K29.70

## 2021-03-20 HISTORY — DX: Vitamin D deficiency, unspecified: E55.9

## 2021-03-20 HISTORY — DX: Essential (primary) hypertension: I10

## 2021-03-20 MED ORDER — CYCLOBENZAPRINE HCL 5 MG PO TABS: 5.0000 mg | ORAL_TABLET | Freq: Three times a day (TID) | ORAL | 0 refills | Status: AC | PRN

## 2021-03-20 NOTE — ED Provider Notes (Signed)
[facility] Provider Note  Patient Contact: 12:32 PM (approximate)   History   Fall   HPI  Patricia Knapp is a 66 y.o. female presents to the ED for evaluation of injuries following mechanical fall.  Patient reports a slip and fall last she had on her side of her bed yesterday.  She got up without the use of her cane, walker, or wheelchair, and noted that her left leg buckled underneath her.  She reports some mild head contusion but denies any laceration or loss of consciousness.  She also reports some pain to the posterior shoulder blade area.  She denies any chest pain, shortness of breath and denies any ongoing leg pain.  Patient presents to the ED in no acute distress noting some generalized muscle soreness patient denies any ongoing weakness above baseline at this time she denies any cuts, scrapes, or abrasions.  Physical Exam   Triage Vital Signs: ED Triage Vitals  Enc Vitals Group     BP 03/20/21 1100 (!) 143/55     Pulse Rate 03/20/21 1100 70     Resp 03/20/21 1100 16     Temp 03/20/21 1100 98 F (36.7 C)     Temp Source 03/20/21 1100 Oral     SpO2 03/20/21 1100 99 %     Weight 03/20/21 1059 263 lb 14.3 oz (119.7 kg)     Height 03/20/21 1059 5\' 7"  (1.702 m)     Head Circumference --      Peak Flow --      Pain Score 03/20/21 1059 4     Pain Loc --      Pain Edu? --      Excl. in [place]? --     Most recent vital signs: Vitals:   03/20/21 1100  BP: (!) 143/55  Pulse: 70  Resp: 16  Temp: 98 F (36.7 C)  SpO2: 99%     General: Alert and in no acute distress. Head: No acute traumatic findings Neck: No stridor. No cervical spine tenderness to palpation. Cardiovascular:  Good peripheral perfusion Respiratory: Normal respiratory effort without tachypnea or retractions. Lungs CTAB.  Musculoskeletal: Full range of motion to all extremities.  Normal spinal alignment without midline tenderness, spasm, pulmonary, or step-off.  Full active range  of motion of the opportunities bilaterally. Neurologic:  No gross focal neurologic deficits are appreciated.  Skin:   No rash noted Other:   ED Results / Procedures / Treatments   Labs (all labs ordered are listed, but only abnormal results are displayed) Labs Reviewed - No data to display   EKG   RADIOLOGY  Declined by patient  No results found.  PROCEDURES:  Critical Care performed: No  Procedures   MEDICATIONS ORDERED IN ED: Medications - No data to display   IMPRESSION / MDM / [place] / ED COURSE  I reviewed the triage vital signs and the nursing notes.                              Differential diagnosis includes, but is not limited to, lumbar strain, shoulder strain, back contusion, head contusion  Geriatric patient ED evaluation of a slip and fall from the edge of her bed yesterday.  Patient presents in no acute distress noting only mild pain to the posterior side shoulder blade and a mild head contusion.  She denies any loss of consciousness, nausea, vomiting, vision loss.  She also denies any difficulty with range of motion.  Patient is declining any further imaging at this time.  She is inclined however, to take a prescription for a muscle relaxant noting her multiple other drug allergies and sensitivities.  She will follow with primary provider for ongoing symptoms or return to the ED if necessary.     FINAL CLINICAL IMPRESSION(S) / ED DIAGNOSES   Final diagnoses:  Fall in home, initial encounter  Musculoskeletal pain     Rx / DC Orders   ED Discharge Orders          Ordered    cyclobenzaprine (FLEXERIL) 5 MG tablet  3 times daily PRN        03/20/21 1252             Note:  This document was prepared using Dragon voice recognition software and may include unintentional dictation errors.    Melvenia Needles, PA-C 03/20/21 1608    Nena Polio, MD 03/20/21 [phone]

## 2021-03-20 NOTE — Discharge Instructions (Signed)
Your exam is normal at this time. Take your home meds along with the prescription muscle relaxant as needed. Follow-up with your provider for continued symptoms.

## 2021-03-20 NOTE — ED Triage Notes (Signed)
Fall this morning.  Woke up, sat on the edge of the bed for a few minutes.  Got up and was walking to the bathroom and left leg buckled up under her.  C/O neck pain, hit head - no LOC, and back pain.

## 2021-03-30 ENCOUNTER — Telehealth: Payer: Medicare Other | Admitting: Emergency Medicine

## 2021-03-30 DIAGNOSIS — R9389 Abnormal findings on diagnostic imaging of other specified body structures: Secondary | ICD-10-CM | POA: Diagnosis not present

## 2021-03-30 NOTE — Progress Notes (Signed)
Virtual Visit Consent   Patricia Knapp, you are scheduled for a virtual visit with a [facility] provider today.     Just as with appointments in the office, your consent must be obtained to participate.  Your consent will be active for this visit and any virtual visit you may have with one of our providers in the next 365 days.     If you have a MyChart account, a copy of this consent can be sent to you electronically.  All virtual visits are billed to your insurance company just like a traditional visit in the office.    As this is a virtual visit, video technology does not allow for your provider to perform a traditional examination.  This may limit your provider's ability to fully assess your condition.  If your provider identifies any concerns that need to be evaluated in person or the need to arrange testing (such as labs, EKG, etc.), we will make arrangements to do so.     Although advances in technology are sophisticated, we cannot ensure that it will always work on either your end or our end.  If the connection with a video visit is poor, the visit may have to be switched to a telephone visit.  With either a video or telephone visit, we are not always able to ensure that we have a secure connection.     I need to obtain your verbal consent now.   Are you willing to proceed with your visit today?    Patricia Knapp has provided verbal consent on 03/30/2021 for a virtual visit (video or telephone).   Cathlyn Parsons, NP   Date: 03/30/2021 11:37 AM   Virtual Visit via Video Note   I, Cathlyn Parsons, connected with  Patricia Knapp  ([id number], 1955-03-23) on 03/30/21 at 11:00 AM EST by a video-enabled telemedicine application and verified that I am speaking with the correct person using two identifiers.  Location: Patient: Virtual Visit Location Patient: Home Provider: Virtual Visit Location Provider: Home Office   I discussed the limitations of evaluation and management by  telemedicine and the availability of in person appointments. The patient expressed understanding and agreed to proceed.    History of Present Illness: Patricia Knapp is a 66 y.o. who identifies as a female who was assigned female at birth, and is being seen today for follow-up of abnormal imaging results.  Patient had a CT of her chest on 02/19/2021 as a follow-up from an abnormal CT angio of her chest last March.  The December CT of her chest showed a resolution of the consolidation that was seen last March.  However, the CT in December shows a 1.8 cm subcutaneous nodule in her right posterior lower chest wall.  This is not the radiologist interpretation notes that this is not clearly an epidermal inclusion cyst given its internal density and recommended further evaluation with ultrasound.  Patient is not aware of any nodule in her right posterior chest wall and has no symptoms to report in that area.  I discussed these results with the patient, discussed the need for ultrasound, and discussed options for obtaining this ultrasound.  She is scheduled for an ACDF with neurosurgery on February 15 of this year.  I advised her to share the results of the CT scan with her neurosurgeon to make sure that he has no concerns with proceeding if the ultrasound of the subcutaneous nodule was not obtained prior to her surgery  date.  Patient has been seen by [facility] [place]; her new PCP is Patricia Knapp.  Patient has elected to have me forward her CT scan results to her new PCP and she will obtain needed ultrasound through [facility].  I will also forward these results to patient's neurosurgeon.     HPI: HPI  Problems: There are no problems to display for this patient.   Allergies:  Allergies  Allergen Reactions   Latex Hives and Itching   Aspirin Nausea Only and Hives   Celecoxib Hives   Codeine Nausea And Vomiting   Hydrocodone Nausea Only and Nausea And Vomiting   Ibuprofen Other (See  Comments)    Causes Ulcer   Ketorolac Other (See Comments)   Meloxicam Other (See Comments)    STOMACH ULCERS   Metformin Diarrhea   Morphine Nausea And Vomiting and Nausea Only   Oxycodone Hives   Rofecoxib Hives   Tramadol Nausea And Vomiting    N/V   Lovastatin Itching and Nausea Only    Other Reaction: GI Upset   Medications:  Current Outpatient Medications:    ACCU-CHEK AVIVA PLUS test strip, , Disp: , Rfl:    albuterol (PROVENTIL HFA;VENTOLIN HFA) 108 (90 Base) MCG/ACT inhaler, Inhale into the lungs., Disp: , Rfl:    amLODipine (NORVASC) 10 MG tablet, Take by mouth., Disp: , Rfl:    atorvastatin (LIPITOR) 40 MG tablet, Take 40 mg by mouth daily., Disp: , Rfl:    carvedilol (COREG) 25 MG tablet, , Disp: , Rfl:    carvedilol (COREG) 25 MG tablet, Take by mouth., Disp: , Rfl:    cyclobenzaprine (FLEXERIL) 5 MG tablet, Take 1 tablet (5 mg total) by mouth 3 (three) times daily as needed for up to 10 days for muscle spasms., Disp: 30 tablet, Rfl: 0   insulin aspart (NOVOLOG) 100 UNIT/ML FlexPen, Inject into the skin., Disp: , Rfl:    Insulin Pen Needle 31G X 5 MM MISC, as directed., Disp: , Rfl:    insulin regular human CONCENTRATED (HUMULIN R) 500 UNIT/ML kwikpen, , Disp: , Rfl:    NOVOLOG FLEXPEN 100 UNIT/ML FlexPen, , Disp: , Rfl:    omega-3 acid ethyl esters (LOVAZA) 1 g capsule, , Disp: , Rfl:    omega-3 acid ethyl esters (LOVAZA) 1 g capsule, Take by mouth., Disp: , Rfl:    pantoprazole (PROTONIX) 40 MG tablet, Take by mouth., Disp: , Rfl:    TRESIBA FLEXTOUCH 100 UNIT/ML FlexTouch Pen, Inject 40 Units into the skin 2 (two) times daily., Disp: , Rfl:    UNABLE TO FIND, Use 3 (three) times daily. Uses Relion E11.40, Disp: , Rfl:    UNABLE TO FIND, Inject into the skin. liraglutide (VICTOZA) 0.6 mg/0.1 mL (18 mg/3 mL) pen injector, Disp: , Rfl:    UNKNOWN TO PATIENT, Use 1 each 3 (three) times daily. Uses Relion E11.40, Disp: , Rfl:    valsartan-hydrochlorothiazide (DIOVAN-HCT)  320-25 MG tablet, Take by mouth., Disp: , Rfl:    Vitamin D, Ergocalciferol, (DRISDOL) 50000 units CAPS capsule, Take by mouth., Disp: , Rfl:   Observations/Objective: Patient is well-developed, well-nourished in no acute distress.  Resting comfortably  at home.  Head is normocephalic, atraumatic.  No labored breathing.  Speech is clear and coherent with logical content.  Patient is alert and oriented at baseline.    Assessment and Plan: 1. Abnormal finding on imaging  Pt elected to have new PCP f/u results. I routed  CT chest results to PCP Patricia Mound, MD and neurosurgeon Patricia Rasher, MD.   Follow Up Instructions: I discussed the assessment and treatment plan with the patient. The patient was provided an opportunity to ask questions and all were answered. The patient agreed with the plan and demonstrated an understanding of the instructions.  A copy of instructions were sent to the patient via MyChart unless otherwise noted below.   The patient was advised to call back or seek an in-person evaluation if the symptoms worsen or if the condition fails to improve as anticipated.  Time:  I spent 8 minutes with the patient via telehealth technology discussing the above problems/concerns.    Cathlyn Parsons, NP

## 2021-05-30 DIAGNOSIS — M25551 Pain in right hip: Secondary | ICD-10-CM | POA: Diagnosis not present

## 2021-06-08 ENCOUNTER — Ambulatory Visit (INDEPENDENT_AMBULATORY_CARE_PROVIDER_SITE_OTHER): Payer: Medicare Other | Admitting: Physician Assistant

## 2021-06-08 ENCOUNTER — Encounter: Payer: Self-pay | Admitting: Physician Assistant

## 2021-06-08 DIAGNOSIS — M25551 Pain in right hip: Secondary | ICD-10-CM | POA: Diagnosis not present

## 2021-06-08 DIAGNOSIS — R5383 Other fatigue: Secondary | ICD-10-CM

## 2021-06-08 DIAGNOSIS — I10 Essential (primary) hypertension: Secondary | ICD-10-CM | POA: Diagnosis not present

## 2021-06-08 DIAGNOSIS — J4520 Mild intermittent asthma, uncomplicated: Secondary | ICD-10-CM

## 2021-06-08 DIAGNOSIS — E1165 Type 2 diabetes mellitus with hyperglycemia: Secondary | ICD-10-CM | POA: Diagnosis not present

## 2021-06-08 DIAGNOSIS — Z7689 Persons encountering health services in other specified circumstances: Secondary | ICD-10-CM

## 2021-06-08 DIAGNOSIS — R21 Rash and other nonspecific skin eruption: Secondary | ICD-10-CM | POA: Diagnosis not present

## 2021-06-08 DIAGNOSIS — Z794 Long term (current) use of insulin: Secondary | ICD-10-CM

## 2021-06-08 MED ORDER — TRIAMCINOLONE ACETONIDE 0.1 % EX CREA: 1.0000 "application " | TOPICAL_CREAM | Freq: Two times a day (BID) | CUTANEOUS | 0 refills | Status: AC

## 2021-06-08 NOTE — Progress Notes (Signed)
[facility] ?[address] ?[place], [place] [address] ? ?Internal MEDICINE  ?Office Visit Note ? ?Patient Name: Patricia Knapp ? [id number]  ?[id number] ? ?Date of Service: 06/08/2021 ? ? ?Complaints/HPI ?Pt is here for establishment of PCP. ?Chief Complaint  ?Patient presents with  ? New Patient (Initial Visit)  ? Diabetes  ? Hyperlipidemia  ? Hypertension  ? Skin Problem  ?  Pt thinks she has eczema on neck and back  ? Quality Metric Gaps  ?  Pt is in need of a Dexa Scan, Foot Exam, Mammogram and Colonoscopy  ? ?HPI ?Pt is here to establish care with her daughter ?-Was at [facility] from Feb until march. Per pt she felt she had some heart problems after this. Was on valsartan-hctz and was switched to diovan without hctz and wasn't aware of change and started having fluid build up. She reports a few other concerns with care that led her to leave their care ?-She has a significant medical hx with Htn, asthma, CHF, diabetes, multiple MSK concerns, and anemia--thinks she may have been diagnosed with alpha thalasemia? But never saw hematology ?-Followed by ortho [facility] who wants to do surgery for her neck due to spinal stenosis in neck and back with arthritis ?-Does report she had a cardiologist when she lived in [place], moved here in 2019, also was seeing pulmonology there as well due to asthma ?-Was having hip pain recently and wasn't eating much so insulin adjusted for this.  Took only 10-12 units then but previously was taking 26units humalog with meals.  ?-She also takes [place] 28units BID and ozempic 1mg  on saturdays ?-Hip pain is on right side, goes down in to leg some too. Went to urgent care and was given steroid shot. Couldn't walk prior or take care of herself. Can normally walk only short distance without back pain. Has a walker and cane, but normally will be in Wheel chair for longer distance. When hip pain was bad 2 weeks ago she wasn't able to walk at all, but is doing better since injection. She  would like ortho referral to establish care for hip as she does still have some pain despite injection ?-She reports she has been disabled since 2001. ?-never been smoker, no alcohol or other substance use ?-Lives with daughter, stays on lower level to avoid stairs ?-Also has concerns of rash on her back that resembles eczema and is itchy. Requests med for this ? ?Current Medication: ?Outpatient Encounter Medications as of 06/08/2021  ?Medication Sig Note  ? acetaminophen (TYLENOL) 650 MG CR tablet Take 650 mg by mouth 2 (two) times daily.   ? albuterol (PROVENTIL HFA;VENTOLIN HFA) 108 (90 Base) MCG/ACT inhaler Inhale into the lungs. 01/29/2016: Received from: [facility] Received Sig: take 2 Puffs by inhalation every 6 hours as needed for Shortness of Breath  ? amLODipine (NORVASC) 10 MG tablet Take by mouth. 01/29/2016: Received from: [facility] Received Sig: Take 1 tablet (10 mg total) by mouth once daily. For blood pressure  ? atorvastatin (LIPITOR) 40 MG tablet Take 40 mg by mouth daily.   ? carvedilol (COREG) 25 MG tablet 12.5 mg in the morning and at bedtime. 01/29/2016: Received from: External Pharmacy  ? glucose blood test strip 1 each by Other route 4 (four) times daily. Use as instructed   ? insulin aspart (NOVOLOG) 100 UNIT/ML FlexPen Inject into the skin. 01/29/2016: Received from: [facility] Received Sig: Inject 30 Units subcutaneously 3 (  three) times daily with meals. Sliding scale  ? insulin lispro (HUMALOG) 100 UNIT/ML injection Inject 26 Units into the skin 3 (three) times daily before meals.   ? Insulin Pen Needle 31G X 5 MM MISC as directed. 01/29/2016: [facility]  ? montelukast (SINGULAIR) 10 MG tablet Take 10 mg by mouth at bedtime.   ? NOVOLOG FLEXPEN 100 UNIT/ML FlexPen  01/29/2016: Received from: External Pharmacy  ? omeprazole (PRILOSEC) 20 MG capsule Take 20 mg by mouth daily.   ? Potassium Chloride (KLOR-CON PO)  Take 20 mg by mouth daily.   ? Semaglutide, 1 MG/DOSE, 2 MG/1.5ML SOPN Inject into the skin.   ? senna (SENOKOT) 8.6 MG TABS tablet Take 2 tablets by mouth in the morning and at bedtime.   ? TRESIBA FLEXTOUCH 100 UNIT/ML FlexTouch Pen Inject 40 Units into the skin 2 (two) times daily.   ? triamcinolone cream (KENALOG) 0.1 % Apply 1 application. topically 2 (two) times daily.   ? valsartan-hydrochlorothiazide (DIOVAN-HCT) 320-25 MG tablet Take by mouth. 01/29/2016: [facility] Received Sig: Take 1 tablet by mouth once daily. For blood pressure  ? Vitamin D, Ergocalciferol, (DRISDOL) 50000 units CAPS capsule Take by mouth. 01/29/2016: [facility] Received Sig: Take 1 capsule (50,000 Units total) by mouth once a week.  ? [DISCONTINUED] carvedilol (COREG) 25 MG tablet Take by mouth. 01/29/2016: [facility] Received Sig: Take 1 tablet (25 mg total) by mouth nightly. For blood pressure  ? [DISCONTINUED] omega-3 acid ethyl esters (LOVAZA) 1 g capsule  01/29/2016: Received from: External Pharmacy  ? [DISCONTINUED] omega-3 acid ethyl esters (LOVAZA) 1 g capsule Take by mouth. 01/29/2016: [facility] Received Sig: Take 1 capsule (1 g total) by mouth once daily. For cholesterol  ? [DISCONTINUED] pantoprazole (PROTONIX) 40 MG tablet Take by mouth. 01/29/2016: [facility] Received Sig: Take 1 tablet (40 mg total) by mouth once daily. For GERD  ? [DISCONTINUED] UNKNOWN TO PATIENT Use 1 each 3 (three) times daily. Uses Relion E11.40 01/29/2016: [facility]  ? ACCU-CHEK AVIVA PLUS test strip  01/29/2016: Received from: External Pharmacy  ? insulin regular human CONCENTRATED (HUMULIN R) 500 UNIT/ML kwikpen  01/29/2016: [facility]  ? [DISCONTINUED] UNABLE TO FIND Use 3 (three) times daily. Uses Relion  E11.40 01/29/2016: [facility]  ? [DISCONTINUED] UNABLE TO FIND Inject into the skin. liraglutide (VICTOZA) 0.6 mg/0.1 mL (18 mg/3 mL) pen injector 01/29/2016: [facility] Received Sig: Inject 0.3 mLs (1.8 mg total) subcutaneously once daily. For diabetes mellitus  ? ?No facility-administered encounter medications on file as of 06/08/2021.  ? ? ?Surgical History: ?Past Surgical History:  ?Procedure Laterality Date  ? ABDOMINAL HYSTERECTOMY    ? ? ?Medical History: ?Past Medical History:  ?Diagnosis Date  ? Carpal tunnel syndrome   ? Diabetes mellitus without complication (HCC)   ? Fibromyalgia   ? Helicobacter pylori gastritis   ? Hyperlipidemia   ? Hypertension   ? Morbid obesity (HCC)   ? Spinal stenosis of cervical region   ? Spinal stenosis of lumbar region   ? Vitamin D deficiency   ? ? ?Family History: ?Family History  ?Problem Relation Age of Onset  ? Diabetes Brother   ? Heart disease Brother   ? ? ?Social History  ? ?Socioeconomic History  ?  Marital status: Married  ?  Spouse name: Not on file  ? Number of children: Not on file  ? Years of education: Not on file  ? Highest education level: Not on file  ?Occupational History  ? Not on file  ?Tobacco Use  ? Smoking status: Never  ? Smokeless tobacco: Never  ?Substance and Sexual Activity  ? Alcohol use: No  ? Drug use: No  ? Sexual activity: Not on file  ?Other Topics Concern  ? Not on file  ?Social History Narrative  ? Not on file  ? ?Social Determinants of Health  ? ?Financial Resource Strain: Not on file  ?Food Insecurity: Not on file  ?Transportation Needs: Not on file  ?Physical Activity: Not on file  ?Stress: Not on file  ?Social Connections: Not on file  ?Intimate Partner Violence: Not on file  ? ? ? ?Review of Systems  ?Constitutional:  Negative for chills, diaphoresis and fatigue.  ?HENT:  Negative for ear pain, postnasal drip and sinus pressure.   ?Eyes:  Negative for photophobia and  visual disturbance.  ?Respiratory:  Negative for cough, shortness of breath and wheezing.   ?Cardiovascular:  Negative for chest pain, palpitations and leg swelling.  ?Gastrointestinal:  Negative for abdomin

## 2021-06-09 ENCOUNTER — Telehealth: Payer: Self-pay

## 2021-06-09 NOTE — Telephone Encounter (Signed)
Ortho referral sent via [facility] to [facility] °

## 2021-06-12 NOTE — Telephone Encounter (Signed)
Ortho appointment>> 06/19/21-Toni ?

## 2021-06-18 ENCOUNTER — Encounter: Payer: Self-pay | Admitting: Physician Assistant

## 2021-06-18 ENCOUNTER — Telehealth (INDEPENDENT_AMBULATORY_CARE_PROVIDER_SITE_OTHER): Payer: Medicare Other | Admitting: Physician Assistant

## 2021-06-18 VITALS — Resp 16 | Ht 66.5 in | Wt 263.0 lb

## 2021-06-18 DIAGNOSIS — J4521 Mild intermittent asthma with (acute) exacerbation: Secondary | ICD-10-CM | POA: Diagnosis not present

## 2021-06-18 MED ORDER — AZITHROMYCIN 250 MG PO TABS: ORAL_TABLET | ORAL | 0 refills | Status: AC

## 2021-06-18 MED ORDER — BENZONATATE 100 MG PO CAPS: 100.0000 mg | ORAL_CAPSULE | Freq: Two times a day (BID) | ORAL | 0 refills | Status: DC | PRN

## 2021-06-18 NOTE — Progress Notes (Signed)
[facility] ?[address] ?[place], [place] [address] ? ?Internal MEDICINE  ?Telephone Visit ? ?Patient Name: Patricia Knapp ? [id number]  ?[id number] ? ?Date of Service: 06/18/2021 ? ?I connected with the patient at 4:28 by telephone and verified the patients identity using two identifiers.   ?I discussed the limitations, risks, security and privacy concerns of performing an evaluation and management service by telephone and the availability of in person appointments. I also discussed with the patient that there may be a patient responsible charge related to the service.  The patient expressed understanding and agrees to proceed.   ? ?Chief Complaint  ?Patient presents with  ? Cough  ?  Symptoms started 2 days ago.  Deep cough  ? Headache  ? watery eyes  ? sneezing  ? Wheezing  ? Telephone Assessment  ?  [phone]   ? Telephone Screen  ? Sore Throat  ?  From coughing  ? ? ?HPI ?Pt is here for virtual sick visit ?-sneezing today, along with watery/itchy eyes, but started with cough, headache, sore throat, wheezing and SOB a few days ago and has been progressing. States she coughs all night. She doe shave a history of asthma  ?-she has tried Catering manager, cough drops and cough medicine, and is taking singulair and flonase. She has also been using her inhaler BID the past two days. ?-covid test is negative ? ?Current Medication: ?Outpatient Encounter Medications as of 06/18/2021  ?Medication Sig Note  ? acetaminophen (TYLENOL) 650 MG CR tablet Take 650 mg by mouth 2 (two) times daily.   ? albuterol (PROVENTIL HFA;VENTOLIN HFA) 108 (90 Base) MCG/ACT inhaler Inhale into the lungs. 01/29/2016: Received from: [facility] Received Sig: take 2 Puffs by inhalation every 6 hours as needed for Shortness of Breath  ? amLODipine (NORVASC) 10 MG tablet Take by mouth. 01/29/2016: Received from: [facility] Received Sig: Take 1 tablet (10 mg total) by mouth once daily. For blood pressure  ?  atorvastatin (LIPITOR) 40 MG tablet Take 40 mg by mouth daily.   ? azithromycin (ZITHROMAX) 250 MG tablet Take 2 tablets on day 1, then 1 tablet daily on days 2 through 5   ? benzonatate (TESSALON) 100 MG capsule Take 1 capsule (100 mg total) by mouth 2 (two) times daily as needed for cough.   ? carvedilol (COREG) 25 MG tablet 12.5 mg in the morning and at bedtime. 01/29/2016: Received from: External Pharmacy  ? glucose blood test strip 1 each by Other route 4 (four) times daily. Use as instructed   ? insulin aspart (NOVOLOG) 100 UNIT/ML FlexPen Inject into the skin. 01/29/2016: Received from: [facility] Received Sig: Inject 30 Units subcutaneously 3 (three) times daily with meals. Sliding scale  ? insulin lispro (HUMALOG) 100 UNIT/ML injection Inject 26 Units into the skin 3 (three) times daily before meals.   ? Insulin Pen Needle 31G X 5 MM MISC as directed. 01/29/2016: Received from: [facility]  ? montelukast (SINGULAIR) 10 MG tablet Take 10 mg by mouth at bedtime.   ? NOVOLOG FLEXPEN 100 UNIT/ML FlexPen  01/29/2016: Received from: External Pharmacy  ? omeprazole (PRILOSEC) 20 MG capsule Take 20 mg by mouth daily.   ? Potassium Chloride (KLOR-CON PO) Take 20 mg by mouth daily.   ? Semaglutide, 1 MG/DOSE, 2 MG/1.5ML SOPN Inject into the skin.   ? senna (SENOKOT) 8.6 MG TABS tablet Take 2 tablets by mouth in the morning and at  bedtime.   ? TRESIBA FLEXTOUCH 100 UNIT/ML FlexTouch Pen Inject 40 Units into the skin 2 (two) times daily.   ? triamcinolone cream (KENALOG) 0.1 % Apply 1 application. topically 2 (two) times daily.   ? valsartan-hydrochlorothiazide (DIOVAN-HCT) 320-25 MG tablet Take by mouth. 01/29/2016: Received from: [facility] Received Sig: Take 1 tablet by mouth once daily. For blood pressure  ? Vitamin D, Ergocalciferol, (DRISDOL) 50000 units CAPS capsule Take by mouth. 01/29/2016: Received from: [facility] Received Sig: Take 1  capsule (50,000 Units total) by mouth once a week.  ? ACCU-CHEK AVIVA PLUS test strip  01/29/2016: Received from: External Pharmacy  ? insulin regular human CONCENTRATED (HUMULIN R) 500 UNIT/ML kwikpen  01/29/2016: Received from: [facility]  ? ?No facility-administered encounter medications on file as of 06/18/2021.  ? ? ?Surgical History: ?Past Surgical History:  ?Procedure Laterality Date  ? ABDOMINAL HYSTERECTOMY    ? ? ?Medical History: ?Past Medical History:  ?Diagnosis Date  ? Carpal tunnel syndrome   ? Diabetes mellitus without complication (HCC)   ? Fibromyalgia   ? Helicobacter pylori gastritis   ? Hyperlipidemia   ? Hypertension   ? Morbid obesity (HCC)   ? Spinal stenosis of cervical region   ? Spinal stenosis of lumbar region   ? Vitamin D deficiency   ? ? ?Family History: ?Family History  ?Problem Relation Age of Onset  ? Diabetes Brother   ? Heart disease Brother   ? ? ?Social History  ? ?Socioeconomic History  ? Marital status: Married  ?  Spouse name: Not on file  ? Number of children: Not on file  ? Years of education: Not on file  ? Highest education level: Not on file  ?Occupational History  ? Not on file  ?Tobacco Use  ? Smoking status: Never  ? Smokeless tobacco: Never  ?Substance and Sexual Activity  ? Alcohol use: No  ? Drug use: No  ? Sexual activity: Not on file  ?Other Topics Concern  ? Not on file  ?Social History Narrative  ? Not on file  ? ?Social Determinants of Health  ? ?Financial Resource Strain: Not on file  ?Food Insecurity: Not on file  ?Transportation Needs: Not on file  ?Physical Activity: Not on file  ?Stress: Not on file  ?Social Connections: Not on file  ?Intimate Partner Violence: Not on file  ? ? ? ? ?Review of Systems  ?Constitutional:  Negative for fatigue and fever.  ?HENT:  Positive for congestion, postnasal drip, sinus pressure, sneezing and sore throat. Negative for mouth sores.   ?Eyes:  Positive for itching.  ?Respiratory:  Positive for cough,  shortness of breath and wheezing.   ?Cardiovascular:  Negative for chest pain.  ?Genitourinary:  Negative for flank pain.  ?Psychiatric/Behavioral: Negative.    ? ?Vital Signs: ?Resp 16   Ht 5' 6.5" (1.689 m)   Wt 263 lb (119.3 kg)   BMI 41.81 kg/m?  ? ? ?Observation/Objective: ? ?Pt is able to carry out conversation ? ? ?Assessment/Plan: ?1. Mild intermittent asthma with exacerbation ?Will continue Singulair and flonase and start antihistamine OTC. Will also start on zpak and utilize tessalon as needed for cough. May continue inhaler as needed and should rest and stay well hydrated ?- azithromycin (ZITHROMAX) 250 MG tablet; Take 2 tablets on day 1, then 1 tablet daily on days 2 through 5  Dispense: 6 tablet; Refill: 0 ?- benzonatate (TESSALON) 100 MG capsule; Take 1  capsule (100 mg total) by mouth 2 (two) times daily as needed for cough.  Dispense: 20 capsule; Refill: 0 ? ? ?General Counseling: Juliette AlcideHelen verbalizes understanding of the findings of today's phone visit and agrees with plan of treatment. I have discussed any further diagnostic evaluation that may be needed or ordered today. We also reviewed her medications today. she has been encouraged to call the office with any questions or concerns that should arise related to todays visit. ? ? ? ?No orders of the defined types were placed in this encounter. ? ? ?Meds ordered this encounter  ?Medications  ? azithromycin (ZITHROMAX) 250 MG tablet  ?  Sig: Take 2 tablets on day 1, then 1 tablet daily on days 2 through 5  ?  Dispense:  6 tablet  ?  Refill:  0  ? benzonatate (TESSALON) 100 MG capsule  ?  Sig: Take 1 capsule (100 mg total) by mouth 2 (two) times daily as needed for cough.  ?  Dispense:  20 capsule  ?  Refill:  0  ? ? ?Time spent:25 Minutes ? ? ? ?Dr Lyndon CodeFozia M Khan ?Internal medicine  ?

## 2021-06-19 ENCOUNTER — Encounter: Payer: Self-pay | Admitting: Physician Assistant

## 2021-07-13 ENCOUNTER — Telehealth: Payer: Self-pay

## 2021-07-13 ENCOUNTER — Ambulatory Visit: Payer: Medicare Other | Admitting: Physician Assistant

## 2021-07-13 NOTE — Telephone Encounter (Signed)
Patient cancelled 07/13/21 appointment via automated system. Lvm stating she is finding closer pcp to her-Toni ?

## 2021-08-12 ENCOUNTER — Ambulatory Visit: Payer: Medicare Other | Admitting: Internal Medicine

## 2021-08-18 DIAGNOSIS — Z794 Long term (current) use of insulin: Secondary | ICD-10-CM | POA: Diagnosis not present

## 2021-08-18 DIAGNOSIS — I10 Essential (primary) hypertension: Secondary | ICD-10-CM | POA: Diagnosis not present

## 2021-08-18 DIAGNOSIS — E1142 Type 2 diabetes mellitus with diabetic polyneuropathy: Secondary | ICD-10-CM | POA: Diagnosis not present

## 2021-12-04 ENCOUNTER — Other Ambulatory Visit: Payer: Self-pay | Admitting: Family Medicine

## 2021-12-04 ENCOUNTER — Ambulatory Visit
Admission: RE | Admit: 2021-12-04 | Discharge: 2021-12-04 | Disposition: A | Discharge status: Home / Self Care | Payer: Medicare Other | Source: Ambulatory Visit | Attending: Family Medicine | Admitting: Family Medicine

## 2021-12-04 DIAGNOSIS — M7989 Other specified soft tissue disorders: Secondary | ICD-10-CM

## 2021-12-04 DIAGNOSIS — M79605 Pain in left leg: Secondary | ICD-10-CM | POA: Diagnosis present

## 2022-07-12 ENCOUNTER — Ambulatory Visit
Admission: RE | Admit: 2022-07-12 | Discharge: 2022-07-12 | Disposition: A | Discharge status: Home / Self Care | Payer: 59 | Source: Ambulatory Visit

## 2022-07-12 VITALS — BP 131/67 | HR 68 | Temp 97.8°F | Resp 18

## 2022-07-12 DIAGNOSIS — H60502 Unspecified acute noninfective otitis externa, left ear: Secondary | ICD-10-CM

## 2022-07-12 MED ORDER — CIPROFLOXACIN-DEXAMETHASONE 0.3-0.1 % OT SUSP: 4.0000 [drp] | Freq: Two times a day (BID) | OTIC | 0 refills | Status: AC

## 2022-07-12 NOTE — ED Triage Notes (Signed)
Patient to Urgent Care with complaints of left sided pain/ itchiness in her ear. Pain radiates into her jaw/ behind her ear. Reports hx of contact dermatitis and believes this could be in her ear. States her hair line itchy/ itchy scalp.  Reports symptoms started Friday.

## 2022-07-12 NOTE — Discharge Instructions (Addendum)
Use the ear drops as directed.  Follow up with your primary care provider if your symptoms are not improving.    

## 2022-07-12 NOTE — ED Provider Notes (Signed)
[name]    CSN: [id number] Arrival date & time: 07/12/22  1551      History   Chief Complaint Chief Complaint  Patient presents with   Ear Injury    Possible ear infection/dermatitis in ear....pain/itchiness in ear. - Entered by patient    HPI Patricia Knapp is a 67 y.o. female.  Patient presents with left ear pain and itching in her canal.  She also reports mild occasional cough.  No treatments at home.  She denies fever, ear drainage, sore throat, shortness of breath, or other symptoms.  She reports history of atopic dermatitis in her scalp and is followed by dermatology for this.  She states the itching in her ear feels like atopic dermatitis.  Her medical history includes diabetes, hypertension, hyperlipidemia, fibromyalgia, morbid obesity.  The history is provided by the patient and medical records.    Past Medical History:  Diagnosis Date   Carpal tunnel syndrome    Diabetes mellitus without complication (HCC)    Fibromyalgia    Helicobacter pylori gastritis    Hyperlipidemia    Hypertension    Morbid obesity (HCC)    Spinal stenosis of cervical region    Spinal stenosis of lumbar region    Vitamin D deficiency     There are no problems to display for this patient.   Past Surgical History:  Procedure Laterality Date   ABDOMINAL HYSTERECTOMY      OB History   No obstetric history on file.      Home Medications    Prior to Admission medications   Medication Sig Start Date End Date Taking? Authorizing Provider  ciprofloxacin-dexamethasone (CIPRODEX) OTIC suspension Place 4 drops into the left ear 2 (two) times daily for 7 days. 07/12/22 07/19/22 Yes Mickie Bail, NP  MOUNJARO 10 MG/0.5ML Pen Inject into the skin. 06/07/22  Yes [provider]  ACCU-CHEK AVIVA PLUS test strip  12/10/15   [provider]  acetaminophen (TYLENOL) 650 MG CR tablet Take 650 mg by mouth 2 (two) times daily.    [provider]  albuterol  (PROVENTIL HFA;VENTOLIN HFA) 108 (90 Base) MCG/ACT inhaler Inhale into the lungs. 03/21/14   [provider]  amLODipine (NORVASC) 10 MG tablet Take by mouth. 05/14/15   [provider]  atorvastatin (LIPITOR) 40 MG tablet Take 40 mg by mouth daily.    [provider]  benzonatate (TESSALON) 100 MG capsule Take 1 capsule (100 mg total) by mouth 2 (two) times daily as needed for cough. 06/18/21   McDonough, Salomon Fick, PA-C  carvedilol (COREG) 25 MG tablet 12.5 mg in the morning and at bedtime. 01/08/16   [provider]  glucose blood test strip 1 each by Other route 4 (four) times daily. Use as instructed    [provider]  insulin aspart (NOVOLOG) 100 UNIT/ML FlexPen Inject into the skin. 05/14/15   [provider]  insulin lispro (HUMALOG) 100 UNIT/ML injection Inject 26 Units into the skin 3 (three) times daily before meals.    [provider]  Insulin Pen Needle 31G X 5 MM MISC as directed. 05/14/15   [provider]  insulin regular human CONCENTRATED (HUMULIN R) 500 UNIT/ML kwikpen  07/21/15   [provider]  montelukast (SINGULAIR) 10 MG tablet Take 10 mg by mouth at bedtime.    [provider]  NOVOLOG FLEXPEN 100 UNIT/ML FlexPen  12/22/15   [provider]  omeprazole (PRILOSEC) 20 MG capsule Take  20 mg by mouth daily.    [provider]  Potassium Chloride (KLOR-CON PO) Take 20 mg by mouth daily.    [provider]  Semaglutide, 1 MG/DOSE, 2 MG/1.5ML SOPN Inject into the skin.    [provider]  senna (SENOKOT) 8.6 MG TABS tablet Take 2 tablets by mouth in the morning and at bedtime.    [provider]  TRESIBA FLEXTOUCH 100 UNIT/ML FlexTouch Pen Inject 40 Units into the skin 2 (two) times daily. 01/28/20   [provider]  triamcinolone cream (KENALOG) 0.1 % Apply 1 application. topically 2 (two) times daily. 06/08/21   McDonough, Lauren K, PA-C   valsartan-hydrochlorothiazide (DIOVAN-HCT) 320-25 MG tablet Take by mouth. 05/14/15   [provider]  Vitamin D, Ergocalciferol, (DRISDOL) 50000 units CAPS capsule Take by mouth. 05/14/15   [provider]    Family History Family History  Problem Relation Age of Onset   Diabetes Brother    Heart disease Brother     Social History Social History   Tobacco Use   Smoking status: Never   Smokeless tobacco: Never  Substance Use Topics   Alcohol use: No   Drug use: No     Allergies   Latex, Aspirin, Celecoxib, Codeine, Hydrocodone, Ibuprofen, Ketorolac, Meloxicam, Metformin, Morphine, Oxycodone, Rofecoxib, Tramadol, and Lovastatin   Review of Systems Review of Systems  Constitutional:  Negative for chills and fever.  HENT:  Positive for ear pain. Negative for ear discharge and sore throat.   Respiratory:  Positive for cough. Negative for shortness of breath.   Cardiovascular:  Negative for chest pain and palpitations.  Skin:  Negative for color change, rash and wound.     Physical Exam Triage Vital Signs ED Triage Vitals  Enc Vitals Group     BP 07/12/22 1618 131/67     Pulse Rate 07/12/22 1608 68     Resp 07/12/22 1608 18     Temp 07/12/22 1608 97.8 F (36.6 C)     Temp src --      SpO2 07/12/22 1608 98 %     Weight --      Height --      Head Circumference --      Peak Flow --      Pain Score 07/12/22 1615 5     Pain Loc --      Pain Edu? --      Excl. in GC? --    No data found.  Updated Vital Signs BP 131/67   Pulse 68   Temp 97.8 F (36.6 C)   Resp 18   SpO2 98%   Visual Acuity Right Eye Distance:   Left Eye Distance:   Bilateral Distance:    Right Eye Near:   Left Eye Near:    Bilateral Near:     Physical Exam Vitals and nursing note reviewed.  Constitutional:      General: She is not in acute distress.    Appearance: She is well-developed. She is not ill-appearing.  HENT:     Head: Normocephalic and atraumatic.      Right Ear: Tympanic membrane and ear canal normal.     Left Ear: Tympanic membrane normal.     Ears:     Comments: Left ear canal mildly erythematous. No wounds or drainage.     Nose: Nose normal.     Mouth/Throat:     Mouth: Mucous membranes are moist.     Pharynx: Oropharynx is  clear.  Cardiovascular:     Rate and Rhythm: Normal rate and regular rhythm.     Heart sounds: Normal heart sounds.  Pulmonary:     Effort: Pulmonary effort is normal. No respiratory distress.     Breath sounds: Normal breath sounds.  Musculoskeletal:     Cervical back: Neck supple.  Skin:    General: Skin is warm and dry.  Neurological:     Mental Status: She is alert.  Psychiatric:        Mood and Affect: Mood normal.        Behavior: Behavior normal.      UC Treatments / Results  Labs (all labs ordered are listed, but only abnormal results are displayed) Labs Reviewed - No data to display  EKG   Radiology No results found.  Procedures Procedures (including critical care time)  Medications Ordered in UC Medications - No data to display  Initial Impression / Assessment and Plan / UC Course  I have reviewed the triage vital signs and the nursing notes.  Pertinent labs & imaging results that were available during my care of the patient were reviewed by me and considered in my medical decision making (see chart for details).   Left otitis externa.  Treating with Ciprodex eardrops.  Tylenol as needed for discomfort.  Instructed patient to follow up with her PCP if her symptoms are not improving.  She agrees to plan of care.    Final Clinical Impressions(s) / UC Diagnoses   Final diagnoses:  Acute otitis externa of left ear, unspecified type     Discharge Instructions      Use the ear drops as directed.  Follow up with your primary care provider if your symptoms are not improving.        ED Prescriptions     Medication Sig Dispense Auth. Provider    ciprofloxacin-dexamethasone (CIPRODEX) OTIC suspension Place 4 drops into the left ear 2 (two) times daily for 7 days. 7.5 mL Mickie Bail, NP      PDMP not reviewed this encounter.   Mickie Bail, NP 07/12/22 [phone]

## 2022-11-02 ENCOUNTER — Other Ambulatory Visit: Payer: Self-pay | Admitting: Family Medicine

## 2022-11-02 DIAGNOSIS — R4701 Aphasia: Secondary | ICD-10-CM

## 2022-11-02 DIAGNOSIS — R413 Other amnesia: Secondary | ICD-10-CM

## 2022-11-20 ENCOUNTER — Ambulatory Visit: Payer: Medicare PPO

## 2022-12-25 ENCOUNTER — Ambulatory Visit: Admission: RE | Admit: 2022-12-25 | Payer: Medicare PPO | Source: Ambulatory Visit

## 2023-01-14 ENCOUNTER — Ambulatory Visit
Admission: RE | Admit: 2023-01-14 | Discharge: 2023-01-14 | Disposition: A | Discharge status: Home / Self Care | Payer: Medicare PPO | Source: Ambulatory Visit | Attending: Family Medicine | Admitting: Family Medicine

## 2023-01-14 DIAGNOSIS — R4701 Aphasia: Secondary | ICD-10-CM | POA: Insufficient documentation

## 2023-01-14 DIAGNOSIS — R413 Other amnesia: Secondary | ICD-10-CM | POA: Insufficient documentation

## 2023-01-14 MED ORDER — GADOBUTROL 1 MMOL/ML IV SOLN
10.0000 mL | Freq: Once | INTRAVENOUS | Status: AC | PRN
  Administered 2023-01-14: 10 mL via INTRAVENOUS

## 2023-03-02 HISTORY — PX: CERVICAL FUSION: SHX112

## 2023-04-22 DIAGNOSIS — D560 Alpha thalassemia: Secondary | ICD-10-CM | POA: Insufficient documentation

## 2023-04-22 DIAGNOSIS — N1831 Chronic kidney disease, stage 3a: Secondary | ICD-10-CM | POA: Insufficient documentation

## 2023-04-26 IMAGING — CT CT CHEST W/O CM
2 of 4 series · 15 of 36 positions shown, 18 images · non-contrast
Comparison: CT chest dated May 27, 2020.

CLINICAL DATA: Follow-up right lower lobe mass-like consolidation
on prior CT.

EXAM:
CT CHEST WITHOUT CONTRAST
TECHNIQUE: Multidetector CT imaging of the chest was performed following the
standard protocol without IV contrast.

[Series 2: thorax · axial · 0.67mm/px · z∈[-581,-325]mm · 12 of 152 slices shown, 15 images]
[im 12/152  mediastinal]
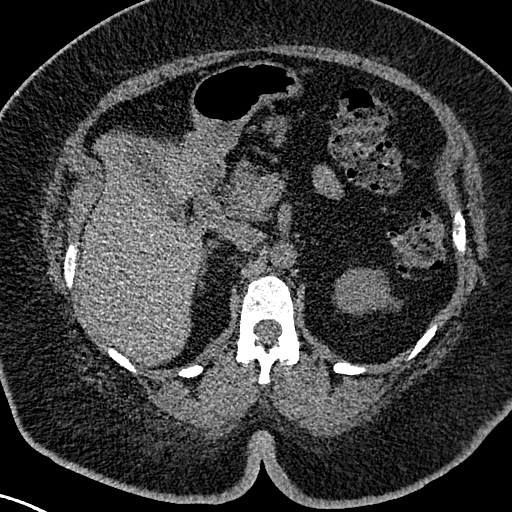
[im 12/152  lung]
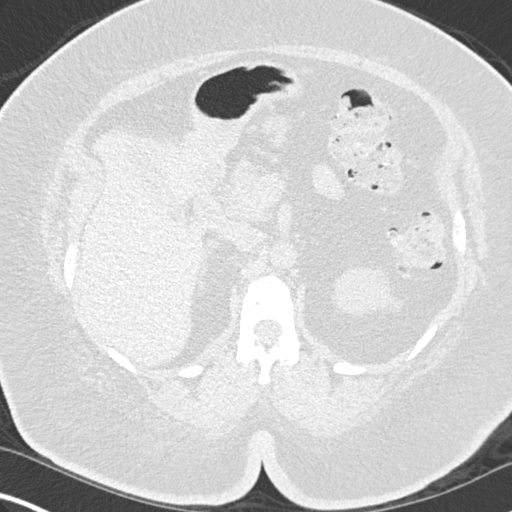
[im 24/152  lung]
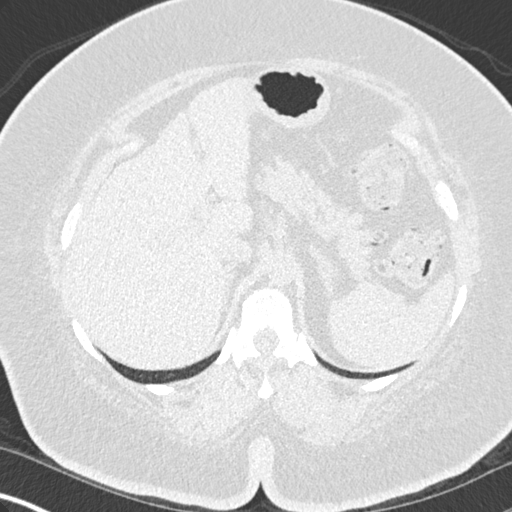
[im 35/152  lung]
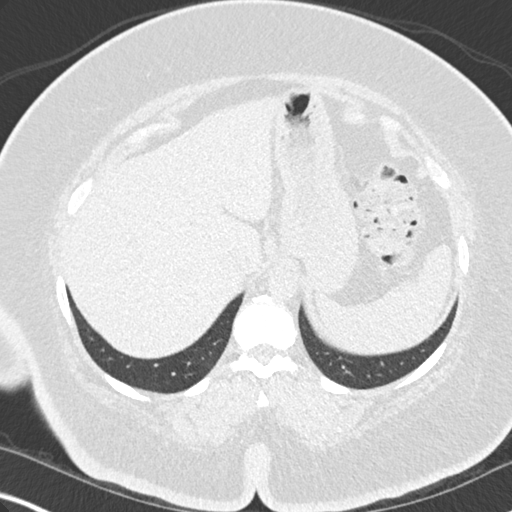
[im 47/152  lung]
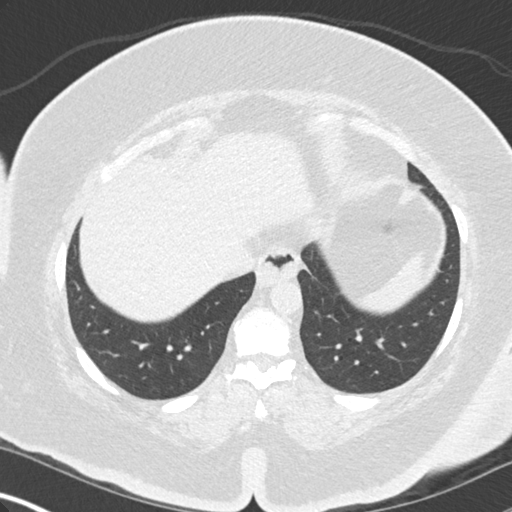
[im 59/152  mediastinal]
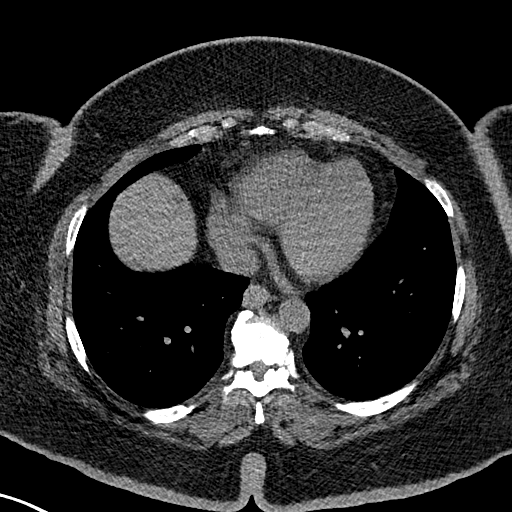
[im 59/152  lung]
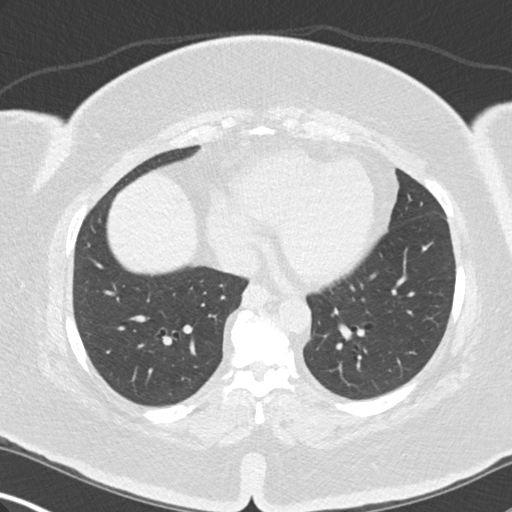
[im 70/152  lung]
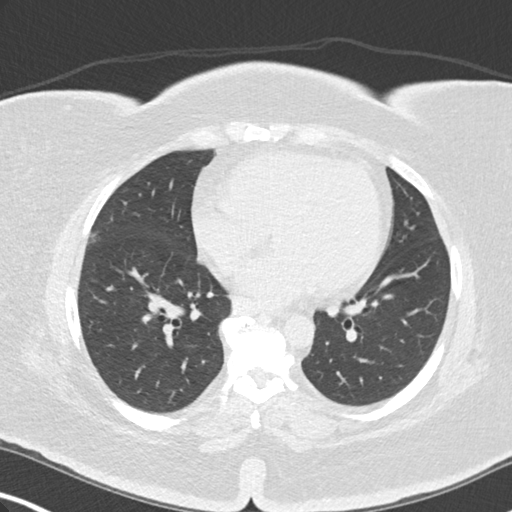
[im 82/152  lung]
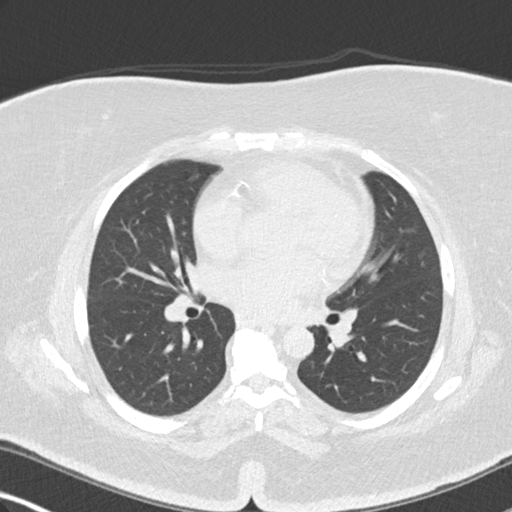
[im 93/152  lung]
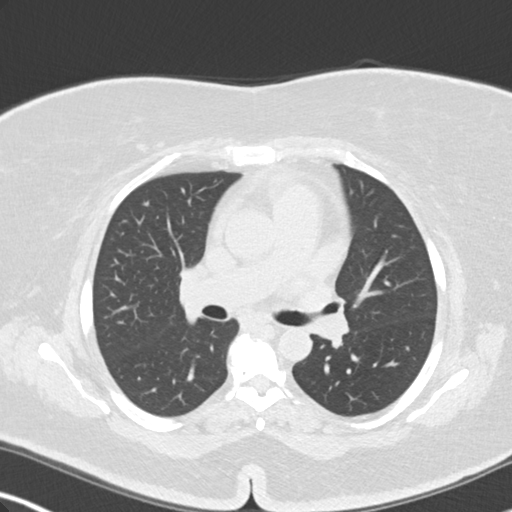
[im 105/152  mediastinal]
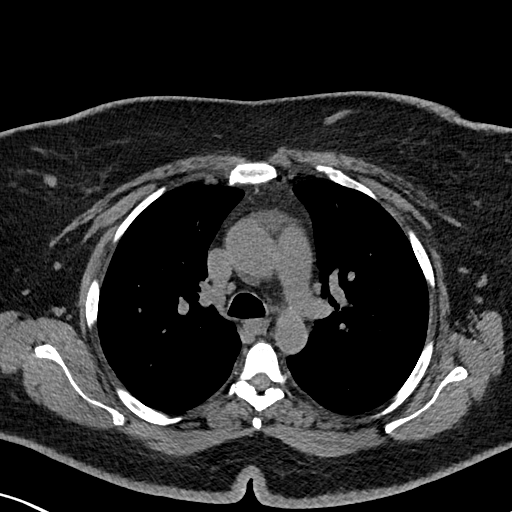
[im 105/152  lung]
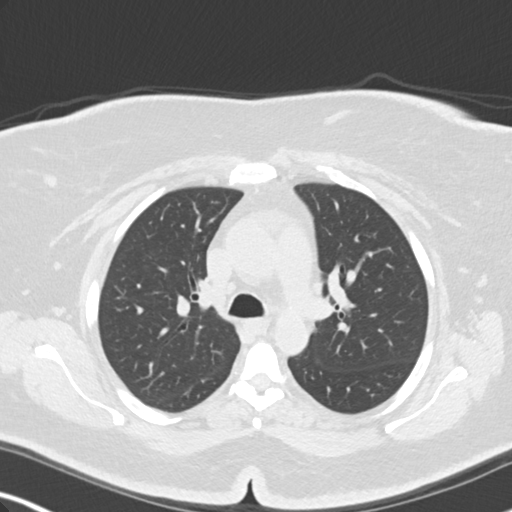
[im 117/152  lung]
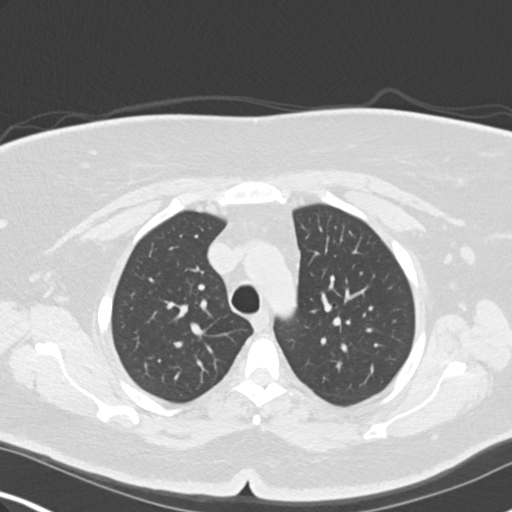
[im 128/152  lung]
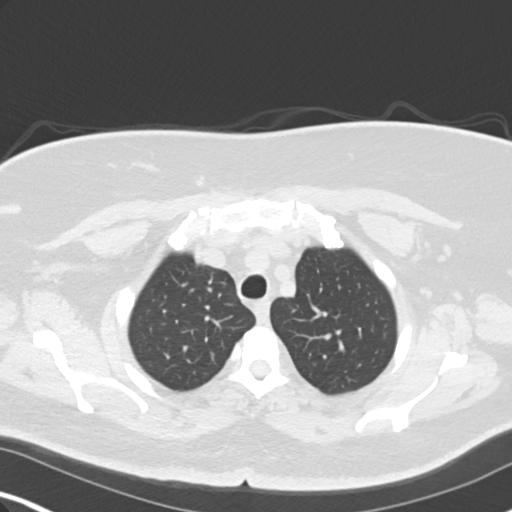
[im 140/152  lung]
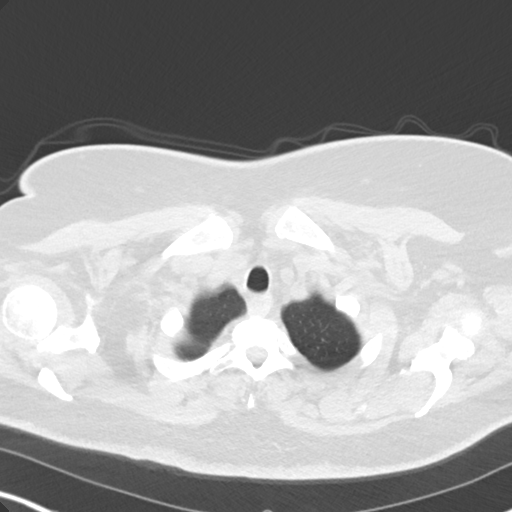

[Series 5: coronal · coronal · 0.71mm/px · 3 of 163 slices shown]
[im 33/163  lung]
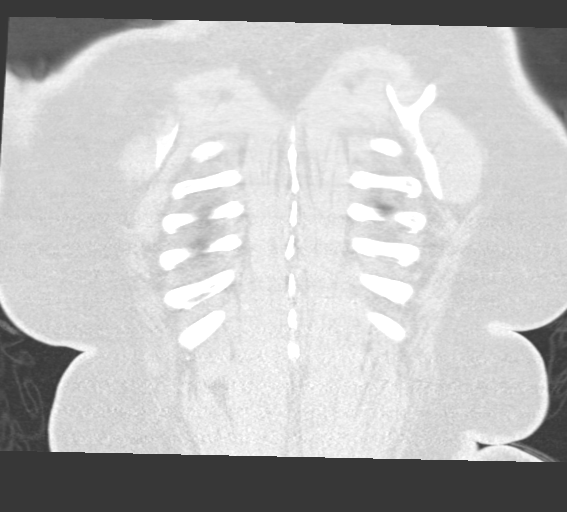
[im 65/163  lung]
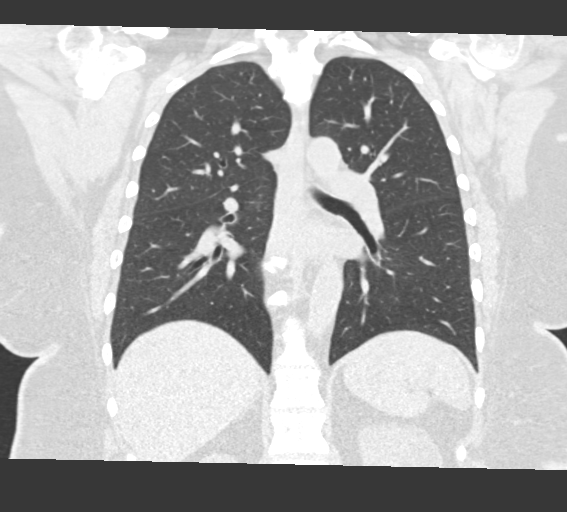
[im 98/163  lung]
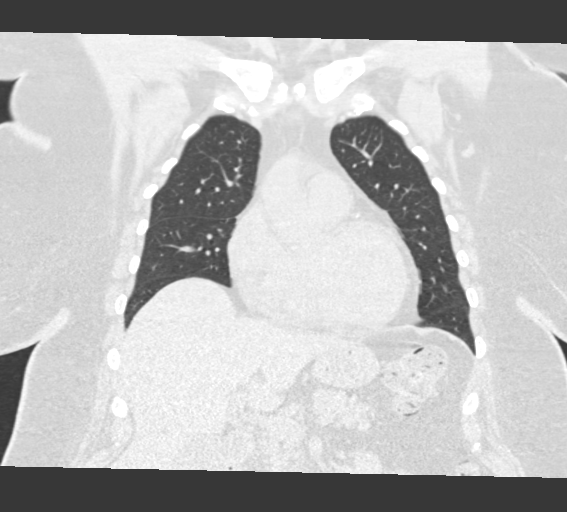

[15 of 36 positions shown; findings below may reference images not displayed]

FINDINGS: Cardiovascular: No significant vascular findings. Normal heart size.
No pericardial effusion. No thoracic aortic aneurysm. Coronary
artery atherosclerosis.

Mediastinum/Nodes: No enlarged mediastinal or axillary lymph nodes.
Thyroid gland, trachea, and esophagus demonstrate no significant
findings.

Lungs/Pleura: Previously seen mass-like consolidation in the medial
aspect of the right lower lobe superior segment has resolved. No
focal consolidation, pleural effusion, or pneumothorax.

Upper Abdomen: No acute abnormality.  Unchanged small hiatal hernia.

Musculoskeletal: No acute or significant osseous findings. 1.8 cm
subcutaneous nodule in the right posterior lower chest wall, not
included in the field of view on the prior study.
IMPRESSION: 1. Previously seen mass-like consolidation in the medial aspect of
the right lower lobe superior segment has resolved, consistent with
resolved pneumonia.
2. 1.8 cm subcutaneous nodule in the right posterior lower chest
wall, not included in the field of view on the prior study. This is
not clearly an epidermal inclusion cyst given its internal density.
Consider further evaluation with ultrasound.

## 2023-05-04 ENCOUNTER — Telehealth: Payer: Self-pay

## 2023-05-04 DIAGNOSIS — J45909 Unspecified asthma, uncomplicated: Secondary | ICD-10-CM | POA: Insufficient documentation

## 2023-05-04 NOTE — Telephone Encounter (Signed)
   Name: Patricia Knapp  DOB: 15-Dec-1955  MRN: [id number]  Primary Cardiologist: None  Chart reviewed as part of pre-operative protocol coverage. The patient has an upcoming visit scheduled with Dr. Vincent Gros on 05/05/2023 at which time clearance can be addressed in case there are any issues that would impact surgical recommendations.  C4-6 ACDF Is not scheduled until TBD as below. I added preop FYI to appointment note so that provider is aware to address at time of outpatient visit.  Per office protocol the cardiology provider should forward their finalized clearance decision and recommendations regarding antiplatelet therapy to the requesting party below.    I will route this message as FYI to requesting party and remove this message from the preop box as separate preop APP input not needed at this time.   Please call with any questions.  Denyce Robert, NP  05/04/2023, 10:39 AM

## 2023-05-04 NOTE — Telephone Encounter (Signed)
   [facility] Pre-operative Risk Assessment    Request for surgical clearance:  What type of surgery is being performed? C4-6 ACDF    When is this surgery scheduled? TBD   What type of clearance is required (medical clearance vs. Pharmacy clearance to hold med vs. Both)? Both  Are there any medications that need to be held prior to surgery and how long?Not specified   Practice name and name of physician performing surgery? Dr. Loyal Gambler at [facility]   What is your office phone number: [phone]    7.   What is your office fax number: [phone]  8.   Anesthesia type (None, local, MAC, general) ? General   Tiburcio Pea Rhylen Pulido 05/04/2023, 9:57 AM  _________________________________________________________________   (provider comments below)

## 2023-05-05 ENCOUNTER — Ambulatory Visit: Payer: Medicaid Other

## 2023-07-05 HISTORY — PX: ANTERIOR CERVICAL DECOMP/DISCECTOMY FUSION: SHX1161

## 2023-07-05 HISTORY — PX: SPINAL FUSION: SHX223

## 2023-07-18 ENCOUNTER — Other Ambulatory Visit: Payer: Self-pay | Admitting: Family Medicine

## 2023-07-18 DIAGNOSIS — R2242 Localized swelling, mass and lump, left lower limb: Secondary | ICD-10-CM

## 2023-07-22 ENCOUNTER — Ambulatory Visit
Admission: RE | Admit: 2023-07-22 | Discharge: 2023-07-22 | Disposition: A | Discharge status: Home / Self Care | Source: Ambulatory Visit | Attending: Family Medicine | Admitting: Family Medicine

## 2023-07-22 DIAGNOSIS — R2242 Localized swelling, mass and lump, left lower limb: Secondary | ICD-10-CM | POA: Insufficient documentation

## 2023-08-03 ENCOUNTER — Other Ambulatory Visit: Payer: Self-pay | Admitting: Family Medicine

## 2023-08-03 DIAGNOSIS — M7989 Other specified soft tissue disorders: Secondary | ICD-10-CM

## 2023-08-07 ENCOUNTER — Ambulatory Visit
Admission: RE | Admit: 2023-08-07 | Discharge: 2023-08-07 | Disposition: A | Discharge status: Home / Self Care | Source: Ambulatory Visit | Attending: Family Medicine | Admitting: Family Medicine

## 2023-08-07 DIAGNOSIS — M7989 Other specified soft tissue disorders: Secondary | ICD-10-CM | POA: Diagnosis present

## 2023-08-17 ENCOUNTER — Ambulatory Visit: Payer: Self-pay | Admitting: Surgery

## 2023-08-17 NOTE — H&P (Signed)
 Subjective:   CC: Lipoma of left lower extremity [D17.24]  HPI:  referred by Aviva Lemmings, NP for evaluation of above.   History of Present Illness Patricia Knapp is a 68 year old female who presents with a left lateral thigh mass.  Approximately one month ago, she noticed a mass on her left lateral thigh with no known injury. Initially the size of an egg, the mass has increased in size. It is sometimes tender to touch but remains soft, with no associated pain or redness. The mass is beginning to interfere with her ability to sit flat.  She previously had a lipoma removed from the right hip in 1995. An ultrasound on May 23rd and a follow-up MRI were unremarkable.  Her medical history includes type two diabetes managed with insulin, diastolic heart failure, hypertension, and obesity. She is currently recovering from spinal surgery.    Past Medical History:  has a past medical history of Anemia, Anxiety, Depression, GERD (gastroesophageal reflux disease), Hyperlipidemia, and Osteoporosis, post-menopausal.  Past Surgical History:  has a past surgical history that includes Cesarean section; Hysterectomy (1999); Colporrhaphy For Repair Cystocele Anterior; lipoma removal (Right); Colonoscopy (N/A, 12/24/2014); and egd (N/A, 12/24/2014).  Family History: family history includes Alcohol abuse in her brother and father; Alzheimer's disease in her mother; Coronary Artery Disease (Blocked arteries around heart) in her brother; Diabetes type II in her brother, brother, father, paternal grandfather, and paternal grandmother; High blood pressure (Hypertension) in her father and mother; Kidney failure in her father; Stroke in her brother; Transient ischemic attack in her mother.  Social History:  reports that she has never smoked. She has never used smokeless tobacco. She reports that she does not drink alcohol and does not use drugs.  Current Medications: has a current medication list which  includes the following prescription(s): acetaminophen, albuterol  mdi (proventil , ventolin , proair ) hfa, amlodipine, atorvastatin, breo ellipta, carvedilol, clobetasol, dexcom g7 sensor, duloxetine, ergocalciferol (vitamin d2), ezetimibe, fluticasone propionate, furosemide, hydrocortisone, insulin lispro, ketoconazole, montelukast, pantoprazole, mounjaro, valsartan-hydrochlorothiazide, and methocarbamol.  Allergies:  Allergies  Allergen Reactions   Aspirin Hives   Celebrex [Celecoxib] Hives   Hydrocodone Nausea and Vomiting   Latex Hives and Itching   Meloxicam Other (See Comments)    STOMACH ULCERS   Metformin Diarrhea   Morphine Nausea and Vomiting   Motrin [Ibuprofen] Other (See Comments)    Causes Ulcer    Oxycodone Hives   Toradol [Ketorolac] Unknown   Tramadol Nausea and Vomiting   Vioxx [Rofecoxib] Hives   Lovastatin Itching and Nausea    Other Reaction: GI Upset    ROS:  A 15 point review of systems was performed and pertinent positives and negatives noted in HPI   Objective:     BP 112/68   Pulse 73   Ht 167.6 cm (5' 6)   Wt (!) 102.5 kg (226 lb)   BMI 36.48 kg/m   Constitutional :  No distress, cooperative, alert  Lymphatics/Throat:  Supple with no lymphadenopathy  Respiratory:  Clear to auscultation bilaterally  Cardiovascular:  Regular rate and rhythm  Gastrointestinal: Soft, non-tender, non-distended, no organomegaly.  Musculoskeletal: Steady gait and movement  Skin: Cool and moist large prominence on left lateral leg, over ITB, no TTP, difficult to truly distinguish a discrete mass based on exam due to extensive surrounding fat.  Psychiatric: Normal affect, non-agitated, not confused         LABS:   N/a  RADS: CLINICAL DATA:  Left thigh mass   EXAM:  ULTRASOUND LEFT LOWER EXTREMITY LIMITED   TECHNIQUE:  Ultrasound examination of the lower extremity soft tissues was  performed in the area of clinical concern.   COMPARISON:  None Available.    FINDINGS:  Limited grayscale and color Doppler sonography of the left lower  extremity in the area of reported asymmetry demonstrates a poorly  circumscribed subcutaneous largely lipomatous mass immediately  subjacent to the subcutaneous fat of the left lower extremity and  superficial to the investing fascia of the subjacent skeletal  musculature. This measures at least 4.4 x 18.3 cm in greatest  dimension and is best seen on summed sagittal image (# 6). No  abnormal associated fluid collection or calcification.   IMPRESSION:  1. Palpable abnormality in the left lower extremity corresponds to a  poorly circumscribed lipomatous mass measuring at least 18.3 cm in  greatest dimension. Contrast enhanced MRI examination is recommended  further evaluation.    Electronically Signed    By: Worthy Heads M.D.    On: 07/29/2023 23:27  This result has an attachment that is not available.  EXAM DESCRIPTION: MR HIP LEFT WO CONTRAST   CLINICAL HISTORY: Pain.   COMPARISON: None Available.   TECHNIQUE: MRI of the hip is performed according to our usual protocol with  multiplanar multi sequence imaging.   FINDINGS: No fracture. No erosions. The sacroiliac joints are unremarkable. No  significant degenerative changes of the hips. The marrow signal is  unremarkable. No labral tear. No joint effusion or bursal fluid. No  subcutaneous edema.   IMPRESSION:  Unremarkable exam.   Electronically signed by: Basilio Both MD 08/09/2023 07:22 PM EDT RP  Workstation: [id number]   Assessment:      Lipoma, left leg- bothersome so will proceed with excision  Plan:     1. Lipoma of left lower extremity [D17.24] Discussed surgical excision.  Alternatives include continued observation.  Benefits include possible symptom relief, pathologic evaluation, improved cosmesis. Discussed the risk of surgery including recurrence, chronic pain, post-op infxn, poor cosmesis, poor/delayed wound healing,  and possible re-operation to address said risks. The risks of general anesthetic, if used, includes MI, CVA, sudden death or even reaction to anesthetic medications also discussed.  Typical post-op recovery time of 3-5 days with possible activity restrictions were also discussed.  The patient verbalized understanding and all questions were answered to the patient's satisfaction.  2. Patient has elected to proceed with surgical treatment. Procedure will be scheduled. Right side down.   labs/images/medications/previous chart entries reviewed personally and relevant changes/updates noted above.

## 2023-09-15 ENCOUNTER — Other Ambulatory Visit: Payer: Self-pay

## 2023-09-15 ENCOUNTER — Encounter
Admission: RE | Admit: 2023-09-15 | Discharge: 2023-09-15 | Disposition: A | Discharge status: Home / Self Care | Source: Ambulatory Visit | Attending: Surgery | Admitting: Surgery

## 2023-09-15 HISTORY — DX: Anemia, unspecified: D64.9

## 2023-09-15 HISTORY — DX: Age-related osteoporosis without current pathological fracture: M81.0

## 2023-09-15 HISTORY — DX: Adhesive capsulitis of unspecified shoulder: M75.00

## 2023-09-15 HISTORY — DX: Other specified postprocedural states: Z98.890

## 2023-09-15 HISTORY — DX: Gastro-esophageal reflux disease without esophagitis: K21.9

## 2023-09-15 HISTORY — DX: Benign lipomatous neoplasm of skin and subcutaneous tissue of left leg: D17.24

## 2023-09-15 HISTORY — DX: Depression, unspecified: F32.A

## 2023-09-15 HISTORY — DX: Anxiety disorder, unspecified: F41.9

## 2023-09-15 NOTE — Patient Instructions (Addendum)
 Your procedure is scheduled on: FRIDAY 09/23/23  Report to the Registration Desk on the 1st floor of the [facility]. To find out your arrival time, please call [phone] between 1PM - 3PM on: THURSDAY 09/22/23 If your arrival time is 6:00 am, do not arrive before that time as the [facility] entrance doors do not open until 6:00 am.  REMEMBER: Instructions that are not followed completely may result in serious medical risk, up to and including death; or upon the discretion of your surgeon and anesthesiologist your surgery may need to be rescheduled.  Do not eat food after midnight the night before surgery.  No gum chewing or hard candies.  You may however, drink WATER up to 2 hours before you are scheduled to arrive for your surgery. Do not drink anything within 2 hours of your scheduled arrival time.  One week prior to surgery: Stop Anti-inflammatories (NSAIDS) such as Advil, Aleve, Ibuprofen, Motrin, Naproxen, Naprosyn and Aspirin based products such as Excedrin, Goody's Powder, BC Powder.  Stop ANY OVER THE COUNTER supplements until after surgery.  You may however, continue to take Tylenol if needed for pain up until the day of surgery.  STOP tirzepatide (MOUNJARO) until after your surgery.  Last dose 09/10/23  ONLY 15 units of the TRESIBA the night before your surgery.  No insulin lispro (HUMALOG) the morning of the surgery.   Continue taking all of your other prescription medications up until the day of surgery.  ON THE DAY OF SURGERY ONLY TAKE THESE MEDICATIONS WITH SIPS OF WATER:  acetaminophen (TYLENOL)  amLODipine (NORVASC)  carvedilol (COREG)  pantoprazole (PROTONIX)   Use inhalers on the day of surgery and bring to the hospital.  No Alcohol for 24 hours before or after surgery.  No Smoking including e-cigarettes for 24 hours before surgery.  No chewable tobacco products for at least 6 hours before surgery.  No nicotine patches on the day of surgery.  Do not  use any recreational drugs for at least a week (preferably 2 weeks) before your surgery.  Please be advised that the combination of cocaine and anesthesia may have negative outcomes, up to and including death. If you test positive for cocaine, your surgery will be cancelled.  On the morning of surgery brush your teeth with toothpaste and water, you may rinse your mouth with mouthwash if you wish. Do not swallow any toothpaste or mouthwash.  Use CHG Soap or wipes as directed on instruction sheet.  Do not wear jewelry, make-up, hairpins, clips or nail polish.  For welded (permanent) jewelry: bracelets, anklets, waist bands, etc.  Please have this removed prior to surgery.  If it is not removed, there is a chance that hospital personnel will need to cut it off on the day of surgery.  Do not wear lotions, powders, or perfumes.   Do not shave body hair from the neck down 48 hours before surgery.  Contact lenses, hearing aids and dentures may not be worn into surgery.  Do not bring valuables to the hospital. [facility] is not responsible for any missing/lost belongings or valuables.   Bring your C-PAP to the hospital in case you may have to spend the night.   Notify your doctor if there is any change in your medical condition (cold, fever, infection).  Wear comfortable clothing (specific to your surgery type) to the hospital.  After surgery, you can help prevent lung complications by doing breathing exercises.  Take deep breaths and cough every 1-2  hours.   If you are being admitted to the hospital overnight, leave your suitcase in the car. After surgery it may be brought to your room.  In case of increased patient census, it may be necessary for you, the patient, to continue your postoperative care in the Same Day Surgery department.  If you are being discharged the day of surgery, you will not be allowed to drive home. You will need a responsible individual to drive you home and  stay with you for 24 hours after surgery.   If you are taking public transportation, you will need to have a responsible individual with you.  Please call the Pre-admissions Testing Dept. at [phone] if you have any questions about these instructions.  Surgery Visitation Policy:   Patients having surgery or a procedure may have two visitors.  Children under the age of 78 must have an adult with them who is not the patient.  Merchandiser, retail to address health-related social needs:  [email or web address]     Preparing for Surgery with CHLORHEXIDINE GLUCONATE (CHG) Soap  Chlorhexidine Gluconate (CHG) Soap  o An antiseptic cleaner that kills germs and bonds with the skin to continue killing germs even after washing  o Used for showering the night before surgery and morning of surgery  Before surgery, you can play an important role by reducing the number of germs on your skin.  CHG (Chlorhexidine gluconate) soap is an antiseptic cleanser which kills germs and bonds with the skin to continue killing germs even after washing.  Please do not use if you have an allergy to CHG or antibacterial soaps. If your skin becomes reddened/irritated stop using the CHG.  1. Shower the NIGHT BEFORE SURGERY and the MORNING OF SURGERY with CHG soap.  2. If you choose to wash your hair, wash your hair first as usual with your normal shampoo.  3. After shampooing, rinse your hair and body thoroughly to remove the shampoo.  4. Use CHG as you would any other liquid soap. You can apply CHG directly to the skin and wash gently with a scrungie or a clean washcloth.  5. Apply the CHG soap to your body only from the neck down. Do not use on open wounds or open sores. Avoid contact with your eyes, ears, mouth, and genitals (private parts). Wash face and genitals (private parts) with your normal soap.  6. Wash thoroughly, paying special attention to the area where your surgery will  be performed.  7. Thoroughly rinse your body with warm water.  8. Do not shower/wash with your normal soap after using and rinsing off the CHG soap.  9. Pat yourself dry with a clean towel.  10. Wear clean pajamas to bed the night before surgery.  12. Place clean sheets on your bed the night of your first shower and do not sleep with pets.  13. Shower again with the CHG soap on the day of surgery prior to arriving at the hospital.  14. Do not apply any deodorants/lotions/powders.  15. Please wear clean clothes to the hospital.

## 2023-09-20 ENCOUNTER — Encounter: Payer: Self-pay | Admitting: Surgery

## 2023-09-20 NOTE — Progress Notes (Signed)
 Perioperative / Anesthesia Services  Pre-Admission Testing Clinical Review / Pre-Operative Anesthesia Consult  Date: 09/20/23  PATIENT DEMOGRAPHICS: Name: Patricia Knapp DOB: October 06, 1955 MRN:   [id number]  Note: Available PAT nursing documentation and vital signs have been reviewed. Clinical nursing staff has updated patient's PMH/PSHx, current medication list, and drug allergies/intolerances to ensure complete and comprehensive history available to assist care teams in MDM as it pertains to the aforementioned surgical procedure and anticipated anesthetic course. Extensive review of available clinical information personally performed.  PMH and PSHx updated with any diagnoses/procedures that  may have been inadvertently omitted during his intake with the pre-admission testing department's nursing staff.  PLANNED SURGICAL PROCEDURE(S):   Case: [id number] Date/Time: 09/23/23 1253   Procedure: EXCISION MASS LOWER EXTREMITIES (Left: Leg Lower) - Please position right side down   Anesthesia type: General   Pre-op diagnosis: D17.24  Lipoma of left lower extremity   Location: [facility] OR ROOM 04 / [facility] ORS FOR ANESTHESIA GROUP   Surgeons: Tye Millet, DO        CLINICAL DISCUSSION: Patricia Knapp is a 68 y.o. female who is submitted for pre-surgical anesthesia review and clearance prior to her undergoing the above procedure. Patient has never been a smoker in the past. Pertinent PMH includes: HFpEF, HTN, HLD, T2DM, GERD (on daily PPI), IDA, OAB, fibromyalgia, LEFT lower extremity, cervical and lumbar spinal stenosis, anxiety, depression, mild cognitive impairment.   Patient is followed by cardiology Juanice, MD). She was last seen in the cardiology clinic on 06/02/2023; notes reviewed. At the time of her clinic visit, patient doing well overall from a cardiovascular perspective. Patient denied any chest pain, shortness of breath, PND, orthopnea, palpitations, significant peripheral  edema, weakness, fatigue, vertiginous symptoms, or presyncope/syncope. Patient with a past medical history significant for cardiovascular diagnoses. Documented physical exam was grossly benign, providing no evidence of acute exacerbation and/or decompensation of the patient's known cardiovascular conditions.  Most recent TTE performed on 05/20/2023 revealed a normal left ventricular systolic function with an EF of >55%. There was mild LVH.  There were no regional wall motion abnormalities. Left ventricular diastolic Doppler parameters consistent with abnormal relaxation (G1DD). Right ventricular size and function normal. There was trivial mitral and pulmonary valve regurgitation.  All transvalvular gradients were noted to be normal providing no evidence of hemodynamically significant valvular stenosis. Aorta normal in size with no evidence of ectasia or aneurysmal dilatation.  Blood pressure well controlled at 126/74  mmHg on currently prescribed CCB (amlodipine), beta-blocker (carvedilol), and diuretic (furosemide) therapies.  Patient is on atorvastatin + ezetimibe for her HLD diagnosis and ASCVD prevention.  T2DM poorly controlled on currently prescribed regimen; last HgbA1c was 8.0% when checked on 06/22/2023.  Patient does not have an OSAH diagnosis. Patient is able to complete all of her  ADL/IADLs without cardiovascular limitation.  Per the DASI, patient is able to achieve at least 4 METS of physical activity without experiencing any significant degree of angina/anginal equivalent symptoms. No changes were made to her medication regimen during her visit with cardiology.  Patient scheduled to follow-up with outpatient cardiology in 3 months or sooner if needed.  DYAMON Knapp is scheduled for an elective EXCISION MASS LOWER EXTREMITIES (Left: Leg Lower) on 09/23/2023 with Dr. Millet Tye, DO. Given patient's past medical history significant for cardiovascular diagnoses, presurgical cardiac clearance  was sought by the PAT team. Per cardiology, this patient is optimized for surgery and may proceed with the planned procedural course with a LOW  risk of significant perioperative cardiovascular complications.  In review of her medication reconciliation, the patient is not noted to be taking any type of anticoagulation or antiplatelet therapies that would need to be held during her perioperative course.  Patient reports previous perioperative complications with anesthesia in the past. Patient has a PMH (+) for PONV. Symptoms and history of PONV will be discussed with patient by anesthesia team on the day of her procedure. Interventions will be ordered as deemed necessary based on patient's individual care needs as determined by anesthesiologist. In review her EMR, it is noted that patient underwent a general anesthetic course at [facility] in 06/2023 without documented complications.   MOST RECENT VITAL SIGNS:    09/15/2023   11:49 AM  Vitals with BMI  Height 5' 6  Weight 226 lbs  BMI 36.49  Systolic   Diastolic   Pulse      PROVIDERS/SPECIALISTS: NOTE: Primary physician provider listed below. Patient may have been seen by APP or partner within same practice.   PROVIDER ROLE / SPECIALTY LAST SHERLEAN Tye Millet, DO  General Surgery (Surgeon) 08/17/2023  Harvey Gaetana CROME, NP Primary Care Provider 07/14/2023  Dewane Shiner, DO Cardiology 06/02/2023  Maree Hila, MD Neurology 08/17/2023   ALLERGIES: Allergies  Allergen Reactions   Latex Hives and Itching   Aspirin Nausea Only and Hives   Celebrex [Celecoxib] Hives   Codeine Nausea And Vomiting   Hydrocodone Nausea And Vomiting and Nausea Only    Can take 5-325 dose      Ibuprofen Other (See Comments)    Causes Ulcer   Ketorolac Other (See Comments)   Meloxicam Other (See Comments)    STOMACH ULCERS   Metformin Diarrhea   Morphine Nausea And Vomiting and Nausea Only    Oxycodone Hives   Rofecoxib Hives   Tramadol Nausea And Vomiting    N/V   Gabapentin Hives    Notes occurred in 2023   Lovastatin Itching and Nausea Only    Other Reaction: GI Upset   Lyrica [Pregabalin] Hives    Notes occurred in 2023    CURRENT HOME MEDICATIONS: No current facility-administered medications for this encounter.    acetaminophen (TYLENOL) 650 MG CR tablet   albuterol  (PROVENTIL  HFA;VENTOLIN  HFA) 108 (90 Base) MCG/ACT inhaler   amLODipine (NORVASC) 10 MG tablet   atorvastatin (LIPITOR) 40 MG tablet   bethanechol (URECHOLINE) 5 MG tablet   carvedilol (COREG) 25 MG tablet   clobetasol cream (TEMOVATE) 0.05 %   cyanocobalamin (VITAMIN B12) 1000 MCG/ML injection   DULoxetine (CYMBALTA) 30 MG capsule   fluticasone (FLONASE) 50 MCG/ACT nasal spray   fluticasone furoate-vilanterol (BREO ELLIPTA) 200-25 MCG/ACT AEPB   folic acid (FOLVITE) 800 MCG tablet   furosemide (LASIX) 20 MG tablet   insulin lispro (HUMALOG) 100 UNIT/ML injection   ketoconazole (NIZORAL) 2 % shampoo   Magnesium 250 MG TABS   montelukast (SINGULAIR) 10 MG tablet   oxybutynin (DITROPAN-XL) 5 MG 24 hr tablet   oxyCODONE (OXY IR/ROXICODONE) 5 MG immediate release tablet   pantoprazole (PROTONIX) 40 MG tablet   senna (SENOKOT) 8.6 MG TABS tablet   tirzepatide (MOUNJARO) 12.5 MG/0.5ML Pen   TRESIBA FLEXTOUCH 100 UNIT/ML FlexTouch Pen   triamcinolone  cream (KENALOG ) 0.1 %   valsartan-hydrochlorothiazide (DIOVAN-HCT) 320-25 MG tablet   Vitamin D, Ergocalciferol, (DRISDOL) 50000 units CAPS capsule   ACCU-CHEK AVIVA PLUS test strip   ezetimibe (ZETIA) 10 MG tablet   Insulin  Pen Needle 31G X 5 MM MISC   potassium chloride SA (KLOR-CON M) 20 MEQ tablet   HISTORY: Past Medical History:  Diagnosis Date   (HFpEF) heart failure with preserved ejection fraction (HCC)    Anemia    Anxiety    B12 deficiency    Carpal tunnel syndrome    Depression    Fibromyalgia    Frozen shoulder syndrome (LEFT)     GERD (gastroesophageal reflux disease)    Helicobacter pylori gastritis    Hyperlipidemia    Hypertension    Lipoma of left lower extremity    Mild cognitive disorder    a.) followed by neurology - likely related to Alzheimers + severe B12 deficiency   Morbid obesity (HCC)    OAB (overactive bladder)    Osteoporosis, post-menopausal    PONV (postoperative nausea and vomiting)    Spinal stenosis of cervical region    a.) s/p ACDF C4-C6   Spinal stenosis of lumbar region    T2DM (type 2 diabetes mellitus) (HCC)    Vitamin D deficiency    Past Surgical History:  Procedure Laterality Date   ABDOMINAL HYSTERECTOMY     CESAREAN SECTION     COLONOSCOPY     COLPORRHAPHY     for repair of cystocele   ESOPHAGOGASTRODUODENOSCOPY     LIPOMA EXCISION Right    SPINAL FUSION  07/05/2023   Family History  Problem Relation Age of Onset   Diabetes Brother    Heart disease Brother    Social History   Tobacco Use   Smoking status: Never   Smokeless tobacco: Never  Substance Use Topics   Alcohol use: No   LABS:  Component Ref Range & Units 06/22/2023  WBC (White Blood Cell Count) 4.1 - 10.2 10^3/uL 6.8  RBC (Red Blood Cell Count) 4.04 - 5.48 10^6/uL 4.24  Hemoglobin 12.0 - 15.0 gm/dL 11 Low   Hematocrit 64.9 - 47.0 % 33.4 Low   MCV (Mean Corpuscular Volume) 80.0 - 100.0 fl 78.8 Low   MCH (Mean Corpuscular Hemoglobin) 27.0 - 31.2 pg 25.9 Low   MCHC (Mean Corpuscular Hemoglobin Concentration) 32.0 - 36.0 gm/dL 67.0  Platelet Count 849 - 450 10^3/uL 380  RDW-CV (Red Cell Distribution Width) 11.6 - 14.8 % 16.3 High   MPV (Mean Platelet Volume) 9.4 - 12.4 fl 9.1 Low   Neutrophils 1.50 - 7.80 10^3/uL 4.67  Lymphocytes 1.00 - 3.60 10^3/uL 1.41  Monocytes 0.00 - 1.50 10^3/uL 0.45  Eosinophils 0.00 - 0.55 10^3/uL 0.13  Basophils 0.00 - 0.09 10^3/uL 0.08  Neutrophil % 32.0 - 70.0 % 69  Lymphocyte % 10.0 - 50.0 % 20.9  Monocyte % 4.0 - 13.0 % 6.7  Eosinophil % 1.0 -  5.0 % 1.9  Basophil% 0.0 - 2.0 % 1.2  Immature Granulocyte % <=0.7 % 0.3  Immature Granulocyte Count <=0.06 10^3/L 0.02   Component Ref Range & Units 06/22/2023  Glucose 70 - 110 mg/dL 879 High   Sodium 863 - 145 mmol/L 138  Potassium 3.6 - 5.1 mmol/L 3.6  Chloride 97 - 109 mmol/L 100  Carbon Dioxide (CO2) 22.0 - 32.0 mmol/L 29.3  Urea Nitrogen (BUN) 7 - 25 mg/dL 20  Creatinine 0.6 - 1.1 mg/dL 1  Glomerular Filtration Rate (eGFR) >60 mL/min/1.73sq m 62  Calcium 8.7 - 10.3 mg/dL 9.4  AST 8 - 39 U/L 24  ALT 5 - 38 U/L 17  Alk Phos (alkaline Phosphatase) 34 - 104 U/L 82  Albumin 3.5 -  4.8 g/dL 4  Bilirubin, Total 0.3 - 1.2 mg/dL 0.4  Protein, Total 6.1 - 7.9 g/dL 7.3  A/G Ratio 1.0 - 5.0 gm/dL 1.2   Component Ref Range & Units 06/22/2023  Hemoglobin A1C 4.2 - 5.6 % 8 High   Average Blood Glucose (Calc) mg/dL 816    ECG: Date: 96/93/7974  Time ECG obtained: 1133 AM Rate: 53 bpm Rhythm: sinus bradycardia Axis (leads I and aVF): normal Intervals: PR 154 ms. QRS 82 ms. QTc 403 ms. ST segment and T wave changes: No evidence of acute T wave abnormalities or significant ST segment elevation or depression.  Evidence of a possible, age undetermined, prior infarct:  No Comparison: Similar to previous tracing obtained on 04/22/2023   IMAGING / PROCEDURES: CT CERVICAL SPINE WITHOUT CONTRAST performed on 09/09/2023 No acute fractures.  Normal alignment.  Anterior spinal fusion hardware spanning C4-C6 with intervertebral spacers, similar to prior.  Multilevel degenerative changes.  Moderate spinal canal narrowing secondary to posterior osteophyte complexes, worst at C4-C5.  Multilevel foraminal narrowing most pronounced bilaterally C3-4 through C6-7, similar to prior.   MR HIP LEFT WO CONTRAST performed on 08/07/2023 No fracture. No erosions.  The sacroiliac joints are unremarkable.  No significant degenerative changes of the hips.  The marrow signal is  unremarkable.  No labral tear.  No joint effusion or bursal fluid.  No subcutaneous edema.  US  LT LOWER EXTREM LTD SOFT TISSUE NON VASCULAR performed on 07/22/2023 Palpable abnormality in the left lower extremity corresponds to a poorly circumscribed lipomatous mass measuring at least 18.3 cm in greatest dimension.  Contrast enhanced MRI examination is recommended further evaluation.   TRANSTHORACIC ECHOCARDIOGRAM performed on 05/20/2023 Normal left ventricular systolic function with EF >55% Mild LVH No regional wall motion abnormalities Left ventricular diastolic Doppler parameters consistent with abnormal relaxation (G1DD). Normal right ventricular size and function Trivial MR, PR, and TR Normal gradients; no valvular stenosis  IMPRESSION AND PLAN: Patricia Knapp has been referred for pre-anesthesia review and clearance prior to her undergoing the planned anesthetic and procedural courses. Available labs, pertinent testing, and imaging results were personally reviewed by me in preparation for upcoming operative/procedural course. [facility] medical record has been updated following extensive record review and patient interview with PAT staff.   This patient has been appropriately cleared by cardiology with an overall LOW risk of patient experiencing significant perioperative cardiovascular complications. Based on clinical review performed today (09/20/23), barring any significant acute changes in the patient's overall condition, it is anticipated that she will be able to proceed with the planned surgical intervention. Any acute changes in clinical condition may necessitate her procedure being postponed and/or cancelled. Patient will meet with anesthesia team (MD and/or CRNA) on the day of her procedure for preoperative evaluation/assessment. Questions regarding anesthetic course will be fielded at that time.   Pre-surgical instructions were reviewed with the patient during his PAT  appointment, and questions were fielded to satisfaction by PAT clinical staff. She has been instructed on which medications that she will need to hold prior to surgery, as well as the ones that have been deemed safe/appropriate to take on the day of her procedure. As part of the general education provided by PAT, patient made aware both verbally and in writing, that she would need to abstain from the use of any illegal substances during her perioperative course. She was advised that failure to follow the provided instructions could necessitate case cancellation or result in serious perioperative complications up to and  including death. Patient encouraged to contact PAT and/or her surgeon's office to discuss any questions or concerns that may arise prior to surgery; verbalized understanding.   Dorise Pereyra, MSN, APRN, FNP-C, CEN [facility]  Perioperative Services Nurse Practitioner Phone: [phone] Fax: [phone] 09/20/23 12:07 PM  NOTE: This note has been prepared using Dragon dictation software. Despite my best ability to proofread, there is always the potential that unintentional transcriptional errors may still occur from this process.

## 2023-09-23 ENCOUNTER — Other Ambulatory Visit: Payer: Self-pay

## 2023-09-23 ENCOUNTER — Ambulatory Visit: Payer: Self-pay | Admitting: Urgent Care

## 2023-09-23 ENCOUNTER — Ambulatory Visit
Admission: RE | Admit: 2023-09-23 | Discharge: 2023-09-23 | Disposition: A | Discharge status: Home / Self Care | Attending: Surgery | Admitting: Surgery

## 2023-09-23 ENCOUNTER — Encounter: Payer: Self-pay | Admitting: Surgery

## 2023-09-23 ENCOUNTER — Encounter
Admission: RE | Disposition: A | Discharge status: Home / Self Care | Payer: Self-pay | Source: Home / Self Care | Attending: Surgery

## 2023-09-23 DIAGNOSIS — Z79899 Other long term (current) drug therapy: Secondary | ICD-10-CM | POA: Insufficient documentation

## 2023-09-23 DIAGNOSIS — E66813 Obesity, class 3: Secondary | ICD-10-CM | POA: Diagnosis not present

## 2023-09-23 DIAGNOSIS — I110 Hypertensive heart disease with heart failure: Secondary | ICD-10-CM | POA: Insufficient documentation

## 2023-09-23 DIAGNOSIS — Z7985 Long-term (current) use of injectable non-insulin antidiabetic drugs: Secondary | ICD-10-CM | POA: Diagnosis not present

## 2023-09-23 DIAGNOSIS — E1165 Type 2 diabetes mellitus with hyperglycemia: Secondary | ICD-10-CM | POA: Diagnosis not present

## 2023-09-23 DIAGNOSIS — D1724 Benign lipomatous neoplasm of skin and subcutaneous tissue of left leg: Secondary | ICD-10-CM | POA: Insufficient documentation

## 2023-09-23 DIAGNOSIS — E785 Hyperlipidemia, unspecified: Secondary | ICD-10-CM | POA: Insufficient documentation

## 2023-09-23 DIAGNOSIS — Z6835 Body mass index (BMI) 35.0-35.9, adult: Secondary | ICD-10-CM | POA: Insufficient documentation

## 2023-09-23 DIAGNOSIS — I5032 Chronic diastolic (congestive) heart failure: Secondary | ICD-10-CM | POA: Diagnosis not present

## 2023-09-23 DIAGNOSIS — F0393 Unspecified dementia, unspecified severity, with mood disturbance: Secondary | ICD-10-CM | POA: Diagnosis not present

## 2023-09-23 DIAGNOSIS — Z794 Long term (current) use of insulin: Secondary | ICD-10-CM | POA: Insufficient documentation

## 2023-09-23 DIAGNOSIS — F0394 Unspecified dementia, unspecified severity, with anxiety: Secondary | ICD-10-CM | POA: Diagnosis not present

## 2023-09-23 DIAGNOSIS — M797 Fibromyalgia: Secondary | ICD-10-CM | POA: Diagnosis not present

## 2023-09-23 DIAGNOSIS — F32A Depression, unspecified: Secondary | ICD-10-CM | POA: Diagnosis not present

## 2023-09-23 DIAGNOSIS — N3281 Overactive bladder: Secondary | ICD-10-CM | POA: Insufficient documentation

## 2023-09-23 DIAGNOSIS — E1142 Type 2 diabetes mellitus with diabetic polyneuropathy: Secondary | ICD-10-CM

## 2023-09-23 DIAGNOSIS — K219 Gastro-esophageal reflux disease without esophagitis: Secondary | ICD-10-CM | POA: Insufficient documentation

## 2023-09-23 DIAGNOSIS — Z01818 Encounter for other preprocedural examination: Secondary | ICD-10-CM

## 2023-09-23 HISTORY — DX: Type 2 diabetes mellitus without complications: E11.9

## 2023-09-23 HISTORY — DX: Deficiency of other specified B group vitamins: E53.8

## 2023-09-23 HISTORY — PX: EXCISION MASS LOWER EXTREMETIES: SHX6705

## 2023-09-23 HISTORY — DX: Unspecified diastolic (congestive) heart failure: I50.30

## 2023-09-23 HISTORY — DX: Overactive bladder: N32.81

## 2023-09-23 HISTORY — DX: Unspecified mental disorder due to known physiological condition: F09

## 2023-09-23 LAB — GLUCOSE, CAPILLARY
Glucose-Capillary: 117 mg/dL — ABNORMAL HIGH (ref 70–99)
Glucose-Capillary: 123 mg/dL — ABNORMAL HIGH (ref 70–99)

## 2023-09-23 SURGERY — EXCISION MASS LOWER EXTREMITIES
Anesthesia: General | Site: Leg Lower | Laterality: Left

## 2023-09-23 MED ORDER — LIDOCAINE HCL (PF) 1 % IJ SOLN
INTRAMUSCULAR | Status: AC
  Filled 2023-09-23: qty 30

## 2023-09-23 MED ORDER — LIDOCAINE HCL 1 % IJ SOLN
INTRAMUSCULAR | Status: DC | PRN
  Administered 2023-09-23: 10 mL via INTRAMUSCULAR

## 2023-09-23 MED ORDER — KETAMINE HCL 50 MG/5ML IJ SOSY
PREFILLED_SYRINGE | INTRAMUSCULAR | Status: DC | PRN
  Administered 2023-09-23 (×2): 20 mg via INTRAVENOUS
  Administered 2023-09-23: 10 mg via INTRAVENOUS

## 2023-09-23 MED ORDER — LIDOCAINE HCL (CARDIAC) PF 100 MG/5ML IV SOSY
PREFILLED_SYRINGE | INTRAVENOUS | Status: DC | PRN
  Administered 2023-09-23: 100 mg via INTRAVENOUS

## 2023-09-23 MED ORDER — PHENYLEPHRINE HCL (PRESSORS) 10 MG/ML IV SOLN
INTRAVENOUS | Status: DC | PRN
  Administered 2023-09-23 (×4): 160 ug via INTRAVENOUS

## 2023-09-23 MED ORDER — MIDAZOLAM HCL 2 MG/2ML IJ SOLN
INTRAMUSCULAR | Status: AC
  Filled 2023-09-23: qty 2

## 2023-09-23 MED ORDER — KETAMINE HCL 50 MG/5ML IJ SOSY
PREFILLED_SYRINGE | INTRAMUSCULAR | Status: AC
  Filled 2023-09-23: qty 5

## 2023-09-23 MED ORDER — ACETAMINOPHEN 10 MG/ML IV SOLN: 1000.0000 mg | Freq: Once | INTRAVENOUS | Status: DC | PRN

## 2023-09-23 MED ORDER — MIDAZOLAM HCL 2 MG/2ML IJ SOLN
INTRAMUSCULAR | Status: DC | PRN
  Administered 2023-09-23: 2 mg via INTRAVENOUS

## 2023-09-23 MED ORDER — CHLORHEXIDINE GLUCONATE CLOTH 2 % EX PADS
6.0000 | MEDICATED_PAD | Freq: Once | CUTANEOUS | Status: AC
  Administered 2023-09-23: 6 via TOPICAL

## 2023-09-23 MED ORDER — PROPOFOL 10 MG/ML IV BOLUS
INTRAVENOUS | Status: DC | PRN
  Administered 2023-09-23: 100 mg via INTRAVENOUS
  Administered 2023-09-23: 50 ug/kg/min via INTRAVENOUS

## 2023-09-23 MED ORDER — ACETAMINOPHEN 10 MG/ML IV SOLN
INTRAVENOUS | Status: AC
Start: 2023-09-23 — End: 2023-09-23
  Filled 2023-09-23: qty 100

## 2023-09-23 MED ORDER — EPHEDRINE SULFATE (PRESSORS) 50 MG/ML IJ SOLN
INTRAMUSCULAR | Status: DC | PRN
  Administered 2023-09-23 (×3): 5 mg via INTRAVENOUS
  Administered 2023-09-23: 10 mg via INTRAVENOUS

## 2023-09-23 MED ORDER — BUPIVACAINE-EPINEPHRINE (PF) 0.5% -1:200000 IJ SOLN
INTRAMUSCULAR | Status: AC
  Filled 2023-09-23: qty 30

## 2023-09-23 MED ORDER — FENTANYL CITRATE (PF) 100 MCG/2ML IJ SOLN
INTRAMUSCULAR | Status: DC | PRN
  Administered 2023-09-23 (×2): 50 ug via INTRAVENOUS

## 2023-09-23 MED ORDER — FENTANYL CITRATE (PF) 100 MCG/2ML IJ SOLN: 25.0000 ug | INTRAMUSCULAR | Status: DC | PRN

## 2023-09-23 MED ORDER — ORAL CARE MOUTH RINSE: 15.0000 mL | Freq: Once | OROMUCOSAL | Status: AC

## 2023-09-23 MED ORDER — CEFAZOLIN SODIUM-DEXTROSE 2-4 GM/100ML-% IV SOLN
2.0000 g | INTRAVENOUS | Status: AC
  Administered 2023-09-23: 2 g via INTRAVENOUS

## 2023-09-23 MED ORDER — CHLORHEXIDINE GLUCONATE 0.12 % MT SOLN
15.0000 mL | Freq: Once | OROMUCOSAL | Status: AC
  Administered 2023-09-23: 15 mL via OROMUCOSAL

## 2023-09-23 MED ORDER — SODIUM CHLORIDE 0.9 % IV SOLN: INTRAVENOUS | Status: DC

## 2023-09-23 MED ORDER — DEXAMETHASONE SODIUM PHOSPHATE 10 MG/ML IJ SOLN
INTRAMUSCULAR | Status: DC | PRN
  Administered 2023-09-23: 10 mg via INTRAVENOUS

## 2023-09-23 MED ORDER — GLYCOPYRROLATE 0.2 MG/ML IJ SOLN
INTRAMUSCULAR | Status: DC | PRN
  Administered 2023-09-23: .2 mg via INTRAVENOUS

## 2023-09-23 MED ORDER — CHLORHEXIDINE GLUCONATE 0.12 % MT SOLN
OROMUCOSAL | Status: AC
  Filled 2023-09-23: qty 15

## 2023-09-23 MED ORDER — ACETAMINOPHEN 10 MG/ML IV SOLN
INTRAVENOUS | Status: DC | PRN
  Administered 2023-09-23: 1000 mg via INTRAVENOUS

## 2023-09-23 MED ORDER — ONDANSETRON HCL 4 MG/2ML IJ SOLN: 4.0000 mg | Freq: Once | INTRAMUSCULAR | Status: DC | PRN

## 2023-09-23 MED ORDER — OXYCODONE HCL 5 MG PO TABS
5.0000 mg | ORAL_TABLET | Freq: Four times a day (QID) | ORAL | 0 refills | Status: AC | PRN
  Filled 2023-09-23: qty 5, 2d supply, fill #0

## 2023-09-23 MED ORDER — ONDANSETRON HCL 4 MG/2ML IJ SOLN
INTRAMUSCULAR | Status: DC | PRN
  Administered 2023-09-23: 4 mg via INTRAVENOUS

## 2023-09-23 MED ORDER — LACTATED RINGERS IV SOLN: INTRAVENOUS | Status: DC

## 2023-09-23 MED ORDER — FENTANYL CITRATE (PF) 100 MCG/2ML IJ SOLN
INTRAMUSCULAR | Status: AC
Start: 2023-09-23 — End: 2023-09-23
  Filled 2023-09-23: qty 2

## 2023-09-23 MED ORDER — SUCCINYLCHOLINE CHLORIDE 200 MG/10ML IV SOSY
PREFILLED_SYRINGE | INTRAVENOUS | Status: DC | PRN
  Administered 2023-09-23: 100 mg via INTRAVENOUS

## 2023-09-23 MED ORDER — CEFAZOLIN SODIUM-DEXTROSE 2-4 GM/100ML-% IV SOLN
INTRAVENOUS | Status: AC
  Filled 2023-09-23: qty 100

## 2023-09-23 SURGICAL SUPPLY — 25 items
BLADE SURG 15 STRL LF DISP TIS (BLADE) ×2 IMPLANT
DERMABOND ADVANCED .7 DNX12 (GAUZE/BANDAGES/DRESSINGS) ×2 IMPLANT
DRAIN CHANNEL JP 15F RND 3/16 (MISCELLANEOUS) IMPLANT
DRAPE LAPAROTOMY 100X77 ABD (DRAPES) ×2 IMPLANT
ELECTRODE REM PT RTRN 9FT ADLT (ELECTROSURGICAL) ×4 IMPLANT
EVACUATOR SILICONE 100CC (DRAIN) IMPLANT
GAUZE 4X4 16PLY ~~LOC~~+RFID DBL (SPONGE) IMPLANT
GLOVE BIOGEL PI IND STRL 7.0 (GLOVE) ×2 IMPLANT
GLOVE SURG SYN 6.5 PF PI (GLOVE) ×6 IMPLANT
GOWN STRL REUS W/ TWL LRG LVL3 (GOWN DISPOSABLE) ×6 IMPLANT
KIT TURNOVER KIT A (KITS) ×2 IMPLANT
MANIFOLD NEPTUNE II (INSTRUMENTS) ×2 IMPLANT
NDL HYPO 22X1.5 SAFETY MO (MISCELLANEOUS) ×2 IMPLANT
NEEDLE HYPO 22X1.5 SAFETY MO (MISCELLANEOUS) ×1 IMPLANT
NS IRRIG 500ML POUR BTL (IV SOLUTION) ×2 IMPLANT
PACK BASIN MINOR ARMC (MISCELLANEOUS) ×2 IMPLANT
SPONGE T-LAP 18X18 ~~LOC~~+RFID (SPONGE) IMPLANT
SUT MNCRL AB 4-0 PS2 18 (SUTURE) ×2 IMPLANT
SUT SILK 3 0 SH 30 (SUTURE) IMPLANT
SUT VIC AB 3-0 SH 27X BRD (SUTURE) IMPLANT
SUTURE EHLN 3-0 FS-10 30 BLK (SUTURE) IMPLANT
SYR 10ML LL (SYRINGE) ×2 IMPLANT
SYR BULB IRRIG 60ML STRL (SYRINGE) ×2 IMPLANT
TRAP FLUID SMOKE EVACUATOR (MISCELLANEOUS) ×2 IMPLANT
WATER STERILE IRR 500ML POUR (IV SOLUTION) ×2 IMPLANT

## 2023-09-23 NOTE — H&P (Signed)
 Subjective:   CC: Lipoma of left lower extremity [D17.24]  HPI: referred by Gaetana Macario Haddock, NP for evaluation of above.   History of Present Illness Patricia Knapp is a 68 year old female who presents with a left lateral thigh mass.  Approximately one month ago, she noticed a mass on her left lateral thigh with no known injury. Initially the size of an egg, the mass has increased in size. It is sometimes tender to touch but remains soft, with no associated pain or redness. The mass is beginning to interfere with her ability to sit flat.  An ultrasound on May 23rd and a follow-up MRI were unremarkable.  Her medical history includes type two diabetes managed with insulin, diastolic heart failure, hypertension, and obesity. She is currently recovering from spinal surgery.   Past Medical History: has a past medical history of Anemia, Anxiety, Depression, GERD (gastroesophageal reflux disease), Hyperlipidemia, and Osteoporosis, post-menopausal.  Past Surgical History: has a past surgical history that includes Cesarean section; Hysterectomy (1999); Colporrhaphy For Repair Cystocele Anterior; lipoma removal (Right); Colonoscopy (N/A, 12/24/2014); and egd (N/A, 12/24/2014).  Family History: family history includes Alcohol abuse in her brother and father; Alzheimer's disease in her mother; Coronary Artery Disease (Blocked arteries around heart) in her brother; Diabetes type II in her brother, brother, father, paternal grandfather, and paternal grandmother; High blood pressure (Hypertension) in her father and mother; Kidney failure in her father; Stroke in her brother; Transient ischemic attack in her mother.  Social History: reports that she has never smoked. She has never used smokeless tobacco. She reports that she does not drink alcohol and does not use drugs.  Current Medications: has a current medication list which includes the following prescription(s): acetaminophen, albuterol  mdi  (proventil , ventolin , proair ) hfa, amlodipine, atorvastatin, breo ellipta, carvedilol, clobetasol, dexcom g7 sensor, duloxetine, ergocalciferol (vitamin d2), ezetimibe, fluticasone propionate, furosemide, hydrocortisone, insulin lispro, ketoconazole, montelukast, pantoprazole, mounjaro, valsartan-hydrochlorothiazide, and methocarbamol.  Allergies:  Allergies  Allergen Reactions  Aspirin Hives  Celebrex [Celecoxib] Hives  Hydrocodone Nausea and Vomiting  Latex Hives and Itching  Meloxicam Other (See Comments)  STOMACH ULCERS  Metformin Diarrhea  Morphine Nausea and Vomiting  Motrin [Ibuprofen] Other (See Comments)  Causes Ulcer  Oxycodone Hives  Toradol [Ketorolac] Unknown  Tramadol Nausea and Vomiting  Vioxx [Rofecoxib] Hives  Lovastatin Itching and Nausea  Other Reaction: GI Upset   ROS:  A 15 point review of systems was performed and pertinent positives and negatives noted in HPI  Objective:    BP 112/68  Pulse 73  Ht 167.6 cm (5' 6)  Wt (!) 102.5 kg (226 lb)  BMI 36.48 kg/m   Constitutional : No distress, cooperative, alert  Lymphatics/Throat: Supple with no lymphadenopathy  Respiratory: Clear to auscultation bilaterally  Cardiovascular: Regular rate and rhythm  Gastrointestinal: Soft, non-tender, non-distended, no organomegaly.  Musculoskeletal: Steady gait and movement  Skin: Cool and moist large prominence on left lateral leg, over ITB, no TTP, difficult to truly distinguish a discrete mass based on exam due to extensive surrounding fat.  Psychiatric: Normal affect, non-agitated, not confused     LABS:  N/a  RADS: CLINICAL DATA: Left thigh mass   EXAM:  ULTRASOUND LEFT LOWER EXTREMITY LIMITED   TECHNIQUE:  Ultrasound examination of the lower extremity soft tissues was  performed in the area of clinical concern.   COMPARISON: None Available.   FINDINGS:  Limited grayscale and color Doppler sonography of the left lower  extremity in the area of  reported  asymmetry demonstrates a poorly  circumscribed subcutaneous largely lipomatous mass immediately  subjacent to the subcutaneous fat of the left lower extremity and  superficial to the investing fascia of the subjacent skeletal  musculature. This measures at least 4.4 x 18.3 cm in greatest  dimension and is best seen on summed sagittal image (# 6). No  abnormal associated fluid collection or calcification.   IMPRESSION:  1. Palpable abnormality in the left lower extremity corresponds to a  poorly circumscribed lipomatous mass measuring at least 18.3 cm in  greatest dimension. Contrast enhanced MRI examination is recommended  further evaluation.   Electronically Signed  By: Dorethia Molt M.D.  On: 07/29/2023 23:27  This result has an attachment that is not available.  EXAM DESCRIPTION: MR HIP LEFT WO CONTRAST   CLINICAL HISTORY: Pain.   COMPARISON: None Available.   TECHNIQUE: MRI of the hip is performed according to our usual protocol with  multiplanar multi sequence imaging.   FINDINGS: No fracture. No erosions. The sacroiliac joints are unremarkable. No  significant degenerative changes of the hips. The marrow signal is  unremarkable. No labral tear. No joint effusion or bursal fluid. No  subcutaneous edema.   IMPRESSION:  Unremarkable exam.   Electronically signed by: Reyes Frees MD 08/09/2023 07:22 PM EDT RP  Workstation: [id number]   Assessment:    Lipoma, left leg- bothersome so will proceed with excision  Plan:    1. Lipoma of left lower extremity [D17.24] Discussed surgical excision. Alternatives include continued observation. Benefits include possible symptom relief, pathologic evaluation, improved cosmesis. Discussed the risk of surgery including recurrence, chronic pain, post-op infxn, poor cosmesis, poor/delayed wound healing, and possible re-operation to address said risks. The risks of general anesthetic, if used, includes MI, CVA, sudden death  or even reaction to anesthetic medications also discussed.  Typical post-op recovery time of 3-5 days with possible activity restrictions were also discussed.  The patient verbalized understanding and all questions were answered to the patient's satisfaction.  2. Patient has elected to proceed with surgical treatment. Procedure will be scheduled. Right side down.

## 2023-09-23 NOTE — Anesthesia Postprocedure Evaluation (Signed)
 Anesthesia Post Note  Patient: Patricia Knapp  Procedure(s) Performed: EXCISION MASS LOWER EXTREMITIES (Left: Leg Lower)  Patient location during evaluation: PACU Anesthesia Type: General Level of consciousness: awake and alert, oriented and patient cooperative Pain management: pain level controlled Vital Signs Assessment: post-procedure vital signs reviewed and stable Respiratory status: spontaneous breathing, nonlabored ventilation and respiratory function stable Cardiovascular status: blood pressure returned to baseline and stable Postop Assessment: adequate PO intake Anesthetic complications: no   No notable events documented.   Last Vitals:  Vitals:   09/23/23 1206 09/23/23 1215  BP:  123/67  Pulse: 73 65  Resp: 17 17  Temp:    SpO2: 97% 97%    Last Pain:  Vitals:   09/23/23 1215  TempSrc:   PainSc: 0-No pain                 Alfonso Ruths

## 2023-09-23 NOTE — Anesthesia Procedure Notes (Signed)
 Procedure Name: Intubation Date/Time: 09/23/2023 10:49 AM  Performed by: Norleen Alberta HERO., CRNAPre-anesthesia Checklist: Patient identified, Patient being monitored, Timeout performed, Emergency Drugs available and Suction available Patient Re-evaluated:Patient Re-evaluated prior to induction Oxygen Delivery Method: Circle system utilized Preoxygenation: Pre-oxygenation with 100% oxygen Induction Type: IV induction Ventilation: Mask ventilation without difficulty Laryngoscope Size: 3 and McGrath Grade View: Grade I Tube type: Oral Tube size: 6.5 mm Number of attempts: 1 Airway Equipment and Method: Stylet Placement Confirmation: ETT inserted through vocal cords under direct vision, positive ETCO2 and breath sounds checked- equal and bilateral Secured at: 18 cm Tube secured with: Tape Dental Injury: Teeth and Oropharynx as per pre-operative assessment

## 2023-09-23 NOTE — Discharge Instructions (Signed)
 Removal, Care After This sheet gives you information about how to care for yourself after your procedure. Your health care provider may also give you more specific instructions. If you have problems or questions, contact your health care provider. What can I expect after the procedure? After the procedure, it is common to have: Soreness. Bruising. Itching. Follow these instructions at home: site care Follow instructions from your health care provider about how to take care of your site. Make sure you: Wash your hands with soap and water before and after you change your bandage (dressing). If soap and water are not available, use hand sanitizer. Leave stitches (sutures), skin glue, or adhesive strips in place. These skin closures may need to stay in place for 2 weeks or longer. If adhesive strip edges start to loosen and curl up, you may trim the loose edges. Do not remove adhesive strips completely unless your health care provider tells you to do that. If the area bleeds or bruises, apply gentle pressure for 10 minutes. OK TO SHOWER IN 24HRS  Check your site every day for signs of infection. Check for: Redness, swelling, or pain. Fluid or blood. Warmth. Pus or a bad smell.  General instructions Rest and then return to your normal activities as told by your health care provider.  tylenol  as needed for discomfort.   Use narcotics, if prescribed, only when tylenol is not enough to control pain.  325-650mg  every 8hrs to max of 3000mg /24hrs (including the 325mg  in every norco dose) for the tylenol.    Advil up to 800mg  per dose every 8hrs as needed for pain.   Keep all follow-up visits as told by your health care provider. This is important. Contact a health care provider if: You have redness, swelling, or pain around your site. You have fluid or blood coming from your site. Your site feels warm to the touch. You have pus or a bad smell coming from your site. You have a fever. Your  sutures, skin glue, or adhesive strips loosen or come off sooner than expected. Get help right away if: You have bleeding that does not stop with pressure or a dressing. Summary After the procedure, it is common to have some soreness, bruising, and itching at the site. Follow instructions from your health care provider about how to take care of your site. Check your site every day for signs of infection. Contact a health care provider if you have redness, swelling, or pain around your site, or your site feels warm to the touch. Keep all follow-up visits as told by your health care provider. This is important. This information is not intended to replace advice given to you by your health care provider. Make sure you discuss any questions you have with your health care provider. Document Released: 03/14/2015 Document Revised: 08/15/2017 Document Reviewed: 08/15/2017 Elsevier Interactive Patient Education  [employer].

## 2023-09-23 NOTE — Interval H&P Note (Signed)
 History and Physical Interval Note:  09/23/2023 9:55 AM  Patricia Knapp  has presented today for surgery, with the diagnosis of D17.24  Lipoma of left lower extremity.  The various methods of treatment have been discussed with the patient and family. After consideration of risks, benefits and other options for treatment, the patient has consented to  Procedure(s) with comments: EXCISION MASS LOWER EXTREMITIES (Left) - Please position right side down as a surgical intervention.  The patient's history has been reviewed, patient examined, no change in status, stable for surgery.  I have reviewed the patient's chart and labs.  Questions were answered to the patient's satisfaction.     Robena Ewy Tye

## 2023-09-23 NOTE — Interval H&P Note (Signed)
 No change. OK to proceed.

## 2023-09-23 NOTE — Op Note (Signed)
 Pre-Op Dx: Left leg lipoma Post-Op Dx: Same Anesthesia: GETA EBL: 30 mL Complications:  none apparent Specimen: Left leg mass, left leg mass inferior margin Procedure: excisional biopsy of left leg lipoma Surgeon: Tye  Indications for procedure: See H&P  Description of Procedure:  Consent obtained, time out performed.  Patient placed in supine position.  Area sterilized and draped in usual position.  Local infused to area previously marked.  12 cm incision made through dermis with 15blade and fibrinous and firm collection of adipose tissue noted in subcutaneous layer.  Additional dissection carried down to the underlying fascia with no indication of typical smooth isolated lipoma.  Due to the prominence of the firm collection of smaller lipomatous tissue encased in fibrinous material, decision made to remove the palpable abnormalities for symptom relief.  After reviewing the ultrasound imaging and report and confirming the location of the palpable abnormality coincides with the imaging findings, the  12 cm x 5 cm x 3.4 cm tissue then removed from surrounding tissue completely using electrocautery, passed off field pending pathology.  Additional inspection of the area noted additional firm tissue towards the inferior aspect and this was removed in a similar fashion.  The second tissue specimen measured 8 cm x 6.7 x 7 cm both specimens then marked with suture, passed off field pending pathology.    Wound hemostasis noted, 15 French round drain placed into the former cavity coming out of skin at the inferior aspect and secured to the skin using 3-0 nylon.  Then closed in two layer fashion with 3-0 vicryl in interrupted fashion for deep dermal layer, then running 4-0 monocryl in subcuticular fashion for epidermal layer.  Wound then dressed with dermabond.  Pt tolerated procedure well, and transferred to PACU in stable condition. Sponge and instrument count correct at end of procedure.

## 2023-09-23 NOTE — Transfer of Care (Signed)
 Immediate Anesthesia Transfer of Care Note  Patient: Patricia Knapp  Procedure(s) Performed: EXCISION MASS LOWER EXTREMITIES (Left: Leg Lower)  Patient Location: PACU  Anesthesia Type:General  Level of Consciousness: drowsy  Airway & Oxygen Therapy: Patient Spontanous Breathing and Patient connected to face mask oxygen  Post-op Assessment: Report given to RN and Post -op Vital signs reviewed and stable  Post vital signs: Reviewed and stable  Last Vitals:  Vitals Value Taken Time  BP 104/53 09/23/23 11:51  Temp 36.1 C 09/23/23 11:51  Pulse 65 09/23/23 11:58  Resp 17 09/23/23 11:58  SpO2 100 % 09/23/23 11:58  Vitals shown include unfiled device data.  Last Pain:  Vitals:   09/23/23 1151  TempSrc:   PainSc: Asleep         Complications: No notable events documented.

## 2023-09-23 NOTE — Anesthesia Preprocedure Evaluation (Addendum)
 Anesthesia Evaluation  Patient identified by MRN, date of birth, ID band Patient awake    Reviewed: Allergy & Precautions, NPO status , Patient's Chart, lab work & pertinent test results  History of Anesthesia Complications (+) PONV and history of anesthetic complications  Airway Mallampati: IV   Neck ROM: Full    Dental  (+) Edentulous Upper, Edentulous Lower   Pulmonary neg pulmonary ROS   Pulmonary exam normal breath sounds clear to auscultation       Cardiovascular hypertension, +CHF (preserved EF)  Normal cardiovascular exam Rhythm:Regular Rate:Normal  Echo 05/30/23:  NORMAL LEFT VENTRICULAR SYSTOLIC FUNCTION WITH MILD LVH  ESTIMATED EF: >55%  NORMAL LA PRESSURES WITH DIASTOLIC DYSFUNCTION (GRADE 1)  NORMAL RIGHT VENTRICULAR SYSTOLIC FUNCTION  VALVULAR REGURGITATION: No AR, TRIVIAL MR, TRIVIAL PR, TRIVIAL TR  NO VALVULAR STENOSIS     Neuro/Psych  PSYCHIATRIC DISORDERS Anxiety Depression   Dementia negative neurological ROS     GI/Hepatic ,GERD  Medicated and Controlled,,  Endo/Other  diabetes, Type 2  Class 3 obesity  Renal/GU      Musculoskeletal  (+)  Fibromyalgia -  Abdominal   Peds  Hematology  (+) Blood dyscrasia, anemia   Anesthesia Other Findings Reviewed and agree with Dorise Boor pre-anesthesia clinical review note.   Reproductive/Obstetrics                              Anesthesia Physical Anesthesia Plan  ASA: 3  Anesthesia Plan: General   Post-op Pain Management:    Induction: Intravenous  PONV Risk Score and Plan: 4 or greater and Ondansetron, Dexamethasone , Treatment may vary due to age or medical condition, Propofol infusion and TIVA  Airway Management Planned: Oral ETT  Additional Equipment:   Intra-op Plan:   Post-operative Plan: Extubation in OR  Informed Consent: I have reviewed the patients History and Physical, chart, labs and discussed the  procedure including the risks, benefits and alternatives for the proposed anesthesia with the patient or authorized representative who has indicated his/her understanding and acceptance.     Dental advisory given  Plan Discussed with: CRNA  Anesthesia Plan Comments: (Patient consented for risks of anesthesia including but not limited to:  - adverse reactions to medications - damage to eyes, teeth, lips or other oral mucosa - nerve damage due to positioning  - sore throat or hoarseness - damage to heart, brain, nerves, lungs, other parts of body or loss of life  Informed patient about role of CRNA in peri- and intra-operative care.  Patient voiced understanding.)         Anesthesia Quick Evaluation

## 2023-09-24 ENCOUNTER — Encounter: Payer: Self-pay | Admitting: Surgery

## 2023-09-26 LAB — SURGICAL PATHOLOGY
